# Patient Record
Sex: Male | Born: 1950 | Race: White | Hispanic: Yes | Marital: Married | Smoking: Never smoker
Health system: Southern US, Community
[De-identification: ages and names within clinical notes are randomized; demographics above are authoritative.]

---

## 2018-05-21 DIAGNOSIS — E119 Type 2 diabetes mellitus without complications: Secondary | ICD-10-CM | POA: Insufficient documentation

## 2018-05-21 DIAGNOSIS — I10 Essential (primary) hypertension: Secondary | ICD-10-CM | POA: Insufficient documentation

## 2018-05-23 DIAGNOSIS — E785 Hyperlipidemia, unspecified: Secondary | ICD-10-CM | POA: Insufficient documentation

## 2020-09-17 DIAGNOSIS — L84 Corns and callosities: Secondary | ICD-10-CM | POA: Diagnosis not present

## 2020-09-17 DIAGNOSIS — E1142 Type 2 diabetes mellitus with diabetic polyneuropathy: Secondary | ICD-10-CM | POA: Diagnosis not present

## 2020-10-02 DIAGNOSIS — R5383 Other fatigue: Secondary | ICD-10-CM | POA: Diagnosis not present

## 2020-10-02 DIAGNOSIS — Z0000 Encounter for general adult medical examination without abnormal findings: Secondary | ICD-10-CM | POA: Diagnosis not present

## 2020-10-08 ENCOUNTER — Ambulatory Visit (INDEPENDENT_AMBULATORY_CARE_PROVIDER_SITE_OTHER): Payer: 59

## 2020-10-08 ENCOUNTER — Ambulatory Visit (INDEPENDENT_AMBULATORY_CARE_PROVIDER_SITE_OTHER): Payer: 59 | Admitting: Podiatry

## 2020-10-08 ENCOUNTER — Encounter: Payer: Self-pay | Admitting: Podiatry

## 2020-10-08 ENCOUNTER — Other Ambulatory Visit: Payer: Self-pay

## 2020-10-08 DIAGNOSIS — E1142 Type 2 diabetes mellitus with diabetic polyneuropathy: Secondary | ICD-10-CM

## 2020-10-08 DIAGNOSIS — M2041 Other hammer toe(s) (acquired), right foot: Secondary | ICD-10-CM

## 2020-10-08 DIAGNOSIS — L603 Nail dystrophy: Secondary | ICD-10-CM | POA: Diagnosis not present

## 2020-10-08 DIAGNOSIS — M19072 Primary osteoarthritis, left ankle and foot: Secondary | ICD-10-CM | POA: Diagnosis not present

## 2020-10-08 DIAGNOSIS — M2042 Other hammer toe(s) (acquired), left foot: Secondary | ICD-10-CM

## 2020-10-11 NOTE — Progress Notes (Signed)
Subjective:  Patient ID: Collin [name], male    DOB: 1950-09-26,  MRN: [id number] HPI Chief Complaint  Patient presents with  . Toe Pain    Hallux left - previous ulcer on toe medially and now noticing a new area starting, just redness and slight abrasion currently, using mupirocin, keeps cushioned   5th toe right - darkened area medial side rubbing 4th toe  . Nail Problem    Hallux nail left - noticed more discoloration lately in the nail, but not sore  . Ankle Pain    Ankle left - ankle rolling inward, had checked before and reconstruction was recommended  . New Patient (Initial Visit)    69 y.o. male presents with the above complaint.   ROS: Denies fever chills nausea vomiting muscle aches pains calf pain back pain chest pain shortness of breath.  No past medical history on file.   Current Outpatient Medications:  .  aspirin EC 81 MG tablet, Take 81 mg by mouth daily. Swallow whole., Disp: , Rfl:  .  dicyclomine (BENTYL) 10 MG capsule, Take 10 mg by mouth 4 (four) times daily -  before meals and at bedtime., Disp: , Rfl:  .  isosorbide dinitrate (ISORDIL) 30 MG tablet, Take 30 mg by mouth 4 (four) times daily., Disp: , Rfl:  .  metoprolol tartrate (LOPRESSOR) 25 MG tablet, Take 25 mg by mouth 2 (two) times daily., Disp: , Rfl:  .  PRESCRIPTION MEDICATION, Domperidone 10mg  TID, Disp: , Rfl:  .  atorvastatin (LIPITOR) 80 MG tablet, Take 80 mg by mouth at bedtime., Disp: , Rfl:  .  gabapentin (NEURONTIN) 100 MG capsule, Take 100 mg by mouth 3 (three) times daily., Disp: , Rfl:  .  JARDIANCE 10 MG TABS tablet, Take 10 mg by mouth daily., Disp: , Rfl:  .  losartan (COZAAR) 25 MG tablet, Take 25 mg by mouth daily., Disp: , Rfl:  .  metFORMIN (GLUCOPHAGE) 1000 MG tablet, Take 1,000 mg by mouth 2 (two) times daily., Disp: , Rfl:  .  NOVOLOG 100 UNIT/ML injection, Inject into the skin., Disp: , Rfl:  .  omeprazole (PRILOSEC) 40 MG capsule, Take 40 mg by mouth daily., Disp: , Rfl:  .   traZODone (DESYREL) 100 MG tablet, Take 100 mg by mouth at bedtime., Disp: , Rfl:   No Known Allergies Review of Systems Objective:  There were no vitals filed for this visit.  General: Well developed, nourished, in no acute distress, alert and oriented x3   Dermatological: Skin is warm, dry and supple bilateral. Nails x 10 are well maintained; remaining integument appears unremarkable at this time. There are no open sores, no preulcerative lesions, no rash or signs of infection present. Multiple toenails appear to be thickened with nail dystrophy. Possible onychomycosis is noted.  Vascular: Dorsalis Pedis artery and Posterior Tibial artery pedal pulses are 2/4 bilateral with immedate capillary fill time. Pedal hair growth present. No varicosities and no lower extremity edema present bilateral.   Neruologic: Grossly absent t via light touch bilateral. Vibratory intact via tuning fork bilateral. Protective threshold with Semmes Wienstein monofilament absent to all pedal sites bilateral. Patellar and Achilles deep tendon reflexes 2+ bilateral. No Babinski or clonus noted bilateral.   Musculoskeletal: No gross boney pedal deformities bilateral. No pain, crepitus, or limitation noted with foot and ankle range of motion bilateral. Muscular strength 5/5 in all groups tested bilateral. Severe pronation of the subtalar joint also appears to be at the level of the  ankle left foot. Appears to be a midfoot collapse as well. Severe eversion of the subtalar joint calcaneus is noted. Right foot appears to be rectus. Mild hammertoe deformities are noted bilateral.  Gait: Unassisted, antalgic to the left side.   Radiographs:  Radiographs taken today of the bilateral foot and left ankle demonstrate an osseously mature individual right foot appears to be relatively normal with minimal osteoarthritic changes and minimal hammertoe deformities. 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 Also demonstrates a severe tibiotalar tilt in a valgus position. Most likely secondary to trauma there is considerable spurring at the plantar aspect of the bilateral foot with some soft tissue increase in density along the medial lateral tendon group on the left over the right.  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  Plan: Discussed etiology pathology conservative surgical therapies at this point I feel getting him in with Dr. Lilian Kapur school to be his best bet as far as considering reconstruction of his left foot and ankle. He will also follow-up with Dr. Donzetta Matters for routine nail debridement.     Juana Haralson T. [place], [place]

## 2020-10-27 DIAGNOSIS — K589 Irritable bowel syndrome without diarrhea: Secondary | ICD-10-CM | POA: Diagnosis not present

## 2020-10-27 DIAGNOSIS — R109 Unspecified abdominal pain: Secondary | ICD-10-CM | POA: Diagnosis not present

## 2020-10-27 DIAGNOSIS — Z955 Presence of coronary angioplasty implant and graft: Secondary | ICD-10-CM | POA: Diagnosis not present

## 2020-10-27 DIAGNOSIS — K219 Gastro-esophageal reflux disease without esophagitis: Secondary | ICD-10-CM | POA: Diagnosis not present

## 2020-11-03 ENCOUNTER — Other Ambulatory Visit: Payer: Self-pay

## 2020-11-03 ENCOUNTER — Ambulatory Visit (INDEPENDENT_AMBULATORY_CARE_PROVIDER_SITE_OTHER): Payer: 59 | Admitting: Podiatry

## 2020-11-03 ENCOUNTER — Ambulatory Visit (INDEPENDENT_AMBULATORY_CARE_PROVIDER_SITE_OTHER): Payer: 59

## 2020-11-03 DIAGNOSIS — E1142 Type 2 diabetes mellitus with diabetic polyneuropathy: Secondary | ICD-10-CM

## 2020-11-03 DIAGNOSIS — M19272 Secondary osteoarthritis, left ankle and foot: Secondary | ICD-10-CM | POA: Diagnosis not present

## 2020-11-03 DIAGNOSIS — M21079 Valgus deformity, not elsewhere classified, unspecified ankle: Secondary | ICD-10-CM

## 2020-11-03 DIAGNOSIS — M19072 Primary osteoarthritis, left ankle and foot: Secondary | ICD-10-CM

## 2020-11-03 DIAGNOSIS — M2042 Other hammer toe(s) (acquired), left foot: Secondary | ICD-10-CM

## 2020-11-03 DIAGNOSIS — M2041 Other hammer toe(s) (acquired), right foot: Secondary | ICD-10-CM | POA: Diagnosis not present

## 2020-11-03 NOTE — Progress Notes (Signed)
Subjective:  Patient ID: Collin [name], male    DOB: 1950-12-27,  MRN: [id number]  Chief Complaint  Patient presents with  . Diabetes    70 y.o. male presents with the above complaint. History confirmed with patient.  He is referred to me by Dr. Al Corpus for consideration of his left ankle and flatfoot deformity.  This is been the case for him for many years and has progressively worsened.  It is gotten the point where it is affecting his walking and is not able to walk for long distances.  He is previously seen by a doctor in [place] when they lived there several years ago and was told that surgery would be too risky and if should have it done would carry high risk.,  After the move to [place] he was seen by another doctor who had him fitted for what sounds like an [place] brace.  This helped at first but due to issues with what types of shoes he was able to wear with it he ended up not wearing it.  He does have type 2 diabetes, he says his A1c about 7%.  He does not smoke  Objective:  Physical Exam: warm, good capillary refill, no trophic changes or ulcerative lesions, normal DP and PT pulses and normal sensory exam.  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  Callus over the TN joint medially.  Large dorsal spurring midfoot.  Pain and edema around the ankle joint and subtalar joint.  He does have some range of motion ankle joint which is painful.  Subtalar joint range of motion appears to be maintained with some pain.   Radiographs: X-ray of the left foot and ankle: Severe pes planovalgus with valgus deformity of the ankle Assessment:   1. Diabetic polyneuropathy associated with type 2 diabetes mellitus (HCC)   2. Arthritis of left ankle   3. Hammer toes of both feet   4. Acquired valgus deformity of ankle   5. Other secondary osteoarthritis of left foot      Plan:   Patient was evaluated and treated and all questions answered.  I had a long discussion with the patient and his wife regarding his radiographic findings, clinical exam findings and what his treatment options would be.  We discussed surgical and nonsurgical treatment options.  He has had previous nonsurgical treatment with an [place] brace and this was unsuccessful.  They interested in surgical correction I think this would be the best route for him before he develops other health issues, or his diabetes becomes uncontrolled.  I think at minimum he will require ankle arthrodesis and some level of midfoot fusion such as a Lapidus or naviculocuneiform fusion in order to stabilize the midfoot and recreate the longitudinal arch.  Unclear if we will be able to salvage the subtalar joint at this point.  I discussed with him what the functionality of both isolated ankle arthrodesis versus tibiotalar calcaneal fusion would be and how special shoe gear may be needed to ambulate properly.  I am ordering MRI to evaluate the integrity of the subtalar joint to see if it is salvageable at this point.  I also gave a diagnostic block with 1.5 cc each of 2% Xylocaine and 0.5% Marcaine, he will monitor symptoms for the next day to see if this offers any pain relief.  If he does have significant pain relief then I think a  significant portion of his pain is from the subtalar joint and would likely require fusion.  Return for follow-up after MRI  Return in about 4 weeks (around 12/01/2020) for after MRI to review, check left ankle, discuss surgery.

## 2020-11-04 ENCOUNTER — Encounter: Payer: Self-pay | Admitting: Podiatry

## 2020-11-11 ENCOUNTER — Ambulatory Visit: Payer: 59 | Admitting: Podiatry

## 2020-11-13 ENCOUNTER — Other Ambulatory Visit: Payer: 59

## 2020-11-17 ENCOUNTER — Telehealth: Payer: Self-pay | Admitting: Podiatry

## 2020-11-17 NOTE — Telephone Encounter (Signed)
Patient calling to inform Dr. Lilian Kapur that Monia Pouch denied the order for MRI at [facility] imaging; would like to know why and how to move forward. Patient is supposed to come in for MRI follow up 4/14. Patient states [place] imaging did not file request until 3/23. Please advise.

## 2020-11-17 NOTE — Telephone Encounter (Signed)
Hi [name], do you know if Monia Pouch denied Mr Collin [name]'s MRI and what needs to be done to move forward with ti?

## 2020-11-23 DIAGNOSIS — E119 Type 2 diabetes mellitus without complications: Secondary | ICD-10-CM | POA: Diagnosis not present

## 2020-11-23 DIAGNOSIS — K219 Gastro-esophageal reflux disease without esophagitis: Secondary | ICD-10-CM | POA: Diagnosis not present

## 2020-11-23 DIAGNOSIS — Z01818 Encounter for other preprocedural examination: Secondary | ICD-10-CM | POA: Diagnosis not present

## 2020-11-24 DIAGNOSIS — E119 Type 2 diabetes mellitus without complications: Secondary | ICD-10-CM | POA: Diagnosis not present

## 2020-11-24 DIAGNOSIS — N481 Balanitis: Secondary | ICD-10-CM | POA: Diagnosis not present

## 2020-11-25 ENCOUNTER — Telehealth: Payer: Self-pay | Admitting: Podiatry

## 2020-11-25 NOTE — Telephone Encounter (Signed)
Patient wife called inquiring about denial for MRI, stated she was told someone would send message and get back with them shortly regarding this matter. No follow up has been completed at this moment, Please Advise

## 2020-11-25 NOTE — Telephone Encounter (Signed)
Ok thanks 

## 2020-11-25 NOTE — Telephone Encounter (Signed)
Also wanted to know if they should reschedule the MRI for later date

## 2020-11-25 NOTE — Telephone Encounter (Signed)
Done, Thanks.

## 2020-11-25 NOTE — Telephone Encounter (Signed)
Looks like he rescheduled his MRI for 4/22 already, can you re-schedule him for me for the following week?

## 2020-11-25 NOTE — Telephone Encounter (Signed)
We are still working on it with the insurance

## 2020-11-27 ENCOUNTER — Ambulatory Visit: Payer: 59 | Admitting: Podiatry

## 2020-11-27 ENCOUNTER — Other Ambulatory Visit: Payer: 59

## 2020-11-27 DIAGNOSIS — K900 Celiac disease: Secondary | ICD-10-CM | POA: Diagnosis not present

## 2020-11-27 DIAGNOSIS — A048 Other specified bacterial intestinal infections: Secondary | ICD-10-CM | POA: Diagnosis not present

## 2020-11-27 DIAGNOSIS — K2270 Barrett's esophagus without dysplasia: Secondary | ICD-10-CM | POA: Diagnosis not present

## 2020-11-27 DIAGNOSIS — K2970 Gastritis, unspecified, without bleeding: Secondary | ICD-10-CM | POA: Diagnosis not present

## 2020-11-30 DIAGNOSIS — K200 Eosinophilic esophagitis: Secondary | ICD-10-CM | POA: Diagnosis not present

## 2020-11-30 DIAGNOSIS — K2281 Esophageal polyp: Secondary | ICD-10-CM | POA: Diagnosis not present

## 2020-12-03 ENCOUNTER — Ambulatory Visit: Payer: 59 | Admitting: Podiatry

## 2020-12-11 ENCOUNTER — Ambulatory Visit
Admission: RE | Admit: 2020-12-11 | Discharge: 2020-12-11 | Disposition: A | Discharge status: Home / Self Care | Payer: 59 | Source: Ambulatory Visit | Attending: Podiatry | Admitting: Podiatry

## 2020-12-11 DIAGNOSIS — S86312A Strain of muscle(s) and tendon(s) of peroneal muscle group at lower leg level, left leg, initial encounter: Secondary | ICD-10-CM | POA: Diagnosis not present

## 2020-12-11 DIAGNOSIS — M65872 Other synovitis and tenosynovitis, left ankle and foot: Secondary | ICD-10-CM | POA: Diagnosis not present

## 2020-12-11 DIAGNOSIS — M1712 Unilateral primary osteoarthritis, left knee: Secondary | ICD-10-CM | POA: Diagnosis not present

## 2020-12-11 DIAGNOSIS — E1142 Type 2 diabetes mellitus with diabetic polyneuropathy: Secondary | ICD-10-CM

## 2020-12-11 DIAGNOSIS — M6258 Muscle wasting and atrophy, not elsewhere classified, other site: Secondary | ICD-10-CM | POA: Diagnosis not present

## 2020-12-11 DIAGNOSIS — M2041 Other hammer toe(s) (acquired), right foot: Secondary | ICD-10-CM

## 2020-12-11 DIAGNOSIS — M21079 Valgus deformity, not elsewhere classified, unspecified ankle: Secondary | ICD-10-CM

## 2020-12-11 DIAGNOSIS — M19072 Primary osteoarthritis, left ankle and foot: Secondary | ICD-10-CM

## 2020-12-11 DIAGNOSIS — M2042 Other hammer toe(s) (acquired), left foot: Secondary | ICD-10-CM

## 2020-12-17 ENCOUNTER — Ambulatory Visit (INDEPENDENT_AMBULATORY_CARE_PROVIDER_SITE_OTHER): Payer: 59 | Admitting: Podiatry

## 2020-12-17 ENCOUNTER — Other Ambulatory Visit: Payer: Self-pay

## 2020-12-17 DIAGNOSIS — M19072 Primary osteoarthritis, left ankle and foot: Secondary | ICD-10-CM

## 2020-12-17 DIAGNOSIS — M21079 Valgus deformity, not elsewhere classified, unspecified ankle: Secondary | ICD-10-CM

## 2020-12-17 DIAGNOSIS — M19272 Secondary osteoarthritis, left ankle and foot: Secondary | ICD-10-CM | POA: Diagnosis not present

## 2020-12-17 DIAGNOSIS — E1142 Type 2 diabetes mellitus with diabetic polyneuropathy: Secondary | ICD-10-CM

## 2020-12-17 NOTE — Patient Instructions (Signed)
We will send your referral to [facility], if you do not hear from them within in 2 weeks please let me know

## 2020-12-19 ENCOUNTER — Encounter: Payer: Self-pay | Admitting: Podiatry

## 2020-12-19 NOTE — Progress Notes (Signed)
Subjective:  Patient ID: Collin [name], male    DOB: 20-May-1951,  MRN: [id number]  Chief Complaint  Patient presents with  . Diabetes    PT stated that he is still having some pain    70 y.o. male presents with the above complaint. History confirmed with patient.  He returns after completing his MRI.  Diagnostic injections did alleviate his pain for that day but then returned  Objective:  Physical Exam: warm, good capillary refill, no trophic changes or ulcerative lesions, normal DP and PT pulses and normal sensory exam.  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  Callus over the TN joint medially.  Large dorsal spurring midfoot.  Pain and edema around the ankle joint and subtalar joint.  He does have some range of motion ankle joint which is painful.  Subtalar joint range of motion appears to be maintained with some pain.   Radiographs: X-ray of the left foot and ankle: Severe pes planovalgus with valgus deformity of the ankle  Study Result  Narrative & Impression  CLINICAL DATA:  Left ankle pain for years.  EXAM: MRI OF THE LEFT ANKLE WITHOUT CONTRAST  TECHNIQUE: Multiplanar, multisequence MR imaging of the ankle was performed. No intravenous contrast was administered.  COMPARISON:  None.  FINDINGS: TENDONS  Peroneal: Peroneal longus tendon intact. Mild tendinosis of the peroneus brevis with a longitudinal split tear of the level of the lateral malleolus and mild tenosynovitis.  Posteromedial: Mild tendinosis of the posterior tibial tendon with mild tenosynovitis. Flexor hallucis longus tendon intact. Flexor digitorum longus tendon intact.  Anterior: Tibialis anterior tendon intact. Extensor hallucis longus tendon intact Extensor digitorum longus tendon intact.  Achilles:  Intact.  Plantar Fascia: Intact. Plantar calcaneal  spur.  LIGAMENTS  Lateral: Thickening of the anterior talofibular ligament likely reflecting prior injury. Calcaneofibular ligament intact. Posterior talofibular ligament intact. Anterior and posterior tibiofibular ligaments intact.  Medial: Deltoid ligament intact. Spring ligament intact.  CARTILAGE  Ankle Joint: Moderate ankle joint effusion. 7 mm loose body in the anterolateral ankle joint space. Partial-thickness cartilage loss throughout the tibiotalar joint.  Subtalar Joints/Sinus Tarsi: Normal subtalar joints. No subtalar joint effusion. Effacement of the normal sinus tarsi fat with subcortical reactive marrow changes in the talus and calcaneus.  Bones: Hindfoot valgus. Reactive marrow changes in the distal tip of the fibula at the articulation with the calcaneus as can be seen with sub fibular impingement. Moderate osteoarthritis of the second tarsometatarsal joint. Mild osteoarthritis of the first TMT joint. Mild osteoarthritis of the talonavicular joint. 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  Soft Tissue: No fluid collection or hematoma. Mild muscle atrophy of the flexor hallucis muscle. Tarsal tunnel is normal.  IMPRESSION: 1. Mild tendinosis of the peroneus brevis with a longitudinal split tear of the level of the lateral malleolus and mild tenosynovitis. 2. Mild tendinosis of the posterior tibial tendon with mild tenosynovitis. 3. Hindfoot valgus. Reactive marrow changes in the distal tip of the fibula at the articulation with the calcaneus as can be seen with sub fibular impingement. 4. Mild osteoarthritis of the first TMT joint. 5. 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   Electronically Signed   By: Elige Ko   On: 12/12/2020 13:24    Assessment:   1. Diabetic polyneuropathy associated with type 2 diabetes mellitus (HCC)   2. Arthritis of left ankle   3. Acquired valgus deformity  of ankle   4.  Other secondary osteoarthritis of left foot      Plan:  Patient was evaluated and treated and all questions answered.  Reviewed the findings of the MRI and clinical exam with the patient and his wife.  I discussed with him that his deformity and arthritis is quite profound and severe and extends throughout the midfoot hindfoot and into the ankle joint.  I discussed with him he likely will need major stage reconstruction with multiple metatarsal fusions as well as subtalar joint fusion, talonavicular fusion and either ankle arthrodesis or ankle replacement.  I think at his age and activity level he be best served with an ankle arthroplasty with implant replacement.  I think with the complexity and severity of the deformity this would be something best handled by an expert in this field.  I am referring him to [facility] orthopedics for this.  Return to see me if he has other issues or he is unable to be treated by them.  Return if symptoms worsen or fail to improve.

## 2020-12-22 DIAGNOSIS — K2900 Acute gastritis without bleeding: Secondary | ICD-10-CM | POA: Diagnosis not present

## 2020-12-22 DIAGNOSIS — K589 Irritable bowel syndrome without diarrhea: Secondary | ICD-10-CM | POA: Diagnosis not present

## 2020-12-22 DIAGNOSIS — Z8719 Personal history of other diseases of the digestive system: Secondary | ICD-10-CM | POA: Diagnosis not present

## 2020-12-22 DIAGNOSIS — Z8619 Personal history of other infectious and parasitic diseases: Secondary | ICD-10-CM | POA: Diagnosis not present

## 2020-12-22 NOTE — Progress Notes (Signed)
I faxed the referral order, last office note and insurance information over to [employer]. They will contact the patient to set-up an appointment.

## 2020-12-24 ENCOUNTER — Telehealth: Payer: Self-pay | Admitting: Podiatry

## 2020-12-24 DIAGNOSIS — N481 Balanitis: Secondary | ICD-10-CM | POA: Diagnosis not present

## 2020-12-24 DIAGNOSIS — E119 Type 2 diabetes mellitus without complications: Secondary | ICD-10-CM | POA: Diagnosis not present

## 2020-12-24 DIAGNOSIS — E7800 Pure hypercholesterolemia, unspecified: Secondary | ICD-10-CM | POA: Diagnosis not present

## 2020-12-24 DIAGNOSIS — Z79899 Other long term (current) drug therapy: Secondary | ICD-10-CM | POA: Diagnosis not present

## 2020-12-24 NOTE — Telephone Encounter (Signed)
Patient called inquiring about referral to [facility], stated they were told if they hadn't heard anything to contact office, Please Advise

## 2020-12-24 NOTE — Telephone Encounter (Signed)
Collin [name] sent it on Monday and they should contact him soon

## 2021-01-04 ENCOUNTER — Other Ambulatory Visit: Payer: Self-pay

## 2021-01-04 ENCOUNTER — Ambulatory Visit (INDEPENDENT_AMBULATORY_CARE_PROVIDER_SITE_OTHER): Payer: 59 | Admitting: Podiatry

## 2021-01-04 DIAGNOSIS — L84 Corns and callosities: Secondary | ICD-10-CM | POA: Diagnosis not present

## 2021-01-04 DIAGNOSIS — M79675 Pain in left toe(s): Secondary | ICD-10-CM

## 2021-01-04 DIAGNOSIS — B351 Tinea unguium: Secondary | ICD-10-CM

## 2021-01-04 DIAGNOSIS — M79674 Pain in right toe(s): Secondary | ICD-10-CM

## 2021-01-04 DIAGNOSIS — E1142 Type 2 diabetes mellitus with diabetic polyneuropathy: Secondary | ICD-10-CM

## 2021-01-07 DIAGNOSIS — Z79899 Other long term (current) drug therapy: Secondary | ICD-10-CM | POA: Diagnosis not present

## 2021-01-07 DIAGNOSIS — E7800 Pure hypercholesterolemia, unspecified: Secondary | ICD-10-CM | POA: Diagnosis not present

## 2021-01-07 DIAGNOSIS — E119 Type 2 diabetes mellitus without complications: Secondary | ICD-10-CM | POA: Diagnosis not present

## 2021-01-08 DIAGNOSIS — L918 Other hypertrophic disorders of the skin: Secondary | ICD-10-CM | POA: Diagnosis not present

## 2021-01-08 DIAGNOSIS — Z1283 Encounter for screening for malignant neoplasm of skin: Secondary | ICD-10-CM | POA: Diagnosis not present

## 2021-01-08 DIAGNOSIS — L814 Other melanin hyperpigmentation: Secondary | ICD-10-CM | POA: Diagnosis not present

## 2021-01-08 DIAGNOSIS — L821 Other seborrheic keratosis: Secondary | ICD-10-CM | POA: Diagnosis not present

## 2021-01-09 ENCOUNTER — Encounter: Payer: Self-pay | Admitting: Podiatry

## 2021-01-09 NOTE — Progress Notes (Signed)
  Subjective:  Patient ID: Collin [name], male    DOB: 12/17/1950,  MRN: [id number]  70 y.o. male presents with preventative diabetic foot care and corn(s) b/l 2nd digits and painful thick toenails that are difficult to trim. Painful toenails interfere with ambulation. Aggravating factors include wearing enclosed shoe gear. Pain is relieved with periodic professional debridement. Painful corns are aggravated when weightbearing when wearing enclosed shoe gear. Pain is relieved with periodic professional debridement..    Patient does not check blood glucose daily.  He is inquiring about his referral to [facility] for consultation of his left ankle as he has not been contacted by them.  PCP: Collin Real, MD and last visit was: Dec 24, 2020.  Review of Systems: Negative except as noted in the HPI.   No Known Allergies  Objective:  There were no vitals filed for this visit. Constitutional Patient is a pleasant 70 y.o. } male in NAD. AAO x 3.  Vascular Capillary refill time to digits immediate b/l. Palpable pedal pulses b/l LE. Pedal hair present. Lower extremity skin temperature gradient within normal limits. No pain with calf compression b/l. Varicosities present b/l. No cyanosis or clubbing noted.  Neurologic Normal speech. Protective sensation intact 5/5 intact bilaterally with 10g monofilament b/l. Vibratory sensation intact b/l. Proprioception intact bilaterally.  Dermatologic Pedal skin with normal turgor, texture and tone bilaterally. No open wounds bilaterally. No interdigital macerations bilaterally. Toenails 1-5 b/l elongated, discolored, dystrophic, thickened, crumbly with subungual debris and tenderness to dorsal palpation. Hyperkeratotic lesion(s) L 2nd toe and R 2nd toe.  No erythema, no edema, no drainage, no fluctuance.  Orthopedic: Normal muscle strength 5/5 to all lower extremity muscle groups bilaterally. Severe pes planovalgus deformity of LLE with total collapse of medial  midfoot. No pain crepitus or joint limitation noted with ROM b/l. Limited joint ROM to the left ankle.    Assessment:   1. Pain due to onychomycosis of toenails of both feet   2. Corns   3. Diabetic polyneuropathy associated with type 2 diabetes mellitus (HCC)    Plan:  Patient was evaluated and treated and all questions answered.  Onychomycosis with pain -Nails palliatively debridement as below. -Educated on self-care  Procedure: Nail Debridement Rationale: Pain Type of Debridement: manual, sharp debridement. Instrumentation: Nail nipper, rotary burr. Number of Nails: 10  -Examined patient. -Patient to continue soft, supportive shoe gear daily. -We did discuss obtaining diabetic shoe gear, but will defer due to him most likely having reconstructive surgery on left foot/ankle in the near future. -Our office will check status of Collin [name]'s referral to [facility] for consultation regarding left foot/ankle OA.  -Toenails 1-5 b/l were debrided in length and girth with sterile nail nippers and dremel without iatrogenic bleeding.  -Corn(s) L 2nd toe and R 2nd toe pared utilizing sterile scalpel blade without complication or incident. Total number debrided=2. -Patient to report any pedal injuries to medical professional immediately. -Patient/POA to call should there be question/concern in the interim.  Return in about 3 months (around 04/06/2021).  Freddie Breech, DPM

## 2021-01-11 NOTE — Telephone Encounter (Signed)
Pt has called back because [facility] has not called her about the referral we sent over. Can someone give me the number so that I can call.  Thanks ;-)

## 2021-01-11 NOTE — Telephone Encounter (Signed)
The main referral line is: [phone]

## 2021-01-12 ENCOUNTER — Telehealth: Payer: Self-pay | Admitting: Podiatry

## 2021-01-12 NOTE — Telephone Encounter (Signed)
The following patient called inquiring about a referral to [facility] for further observation of condition, stated they tried to contact [facility] for scheduling and was informed that the referral hasn't been received, Please Advise

## 2021-01-19 DIAGNOSIS — H3500 Unspecified background retinopathy: Secondary | ICD-10-CM | POA: Diagnosis not present

## 2021-01-19 DIAGNOSIS — G629 Polyneuropathy, unspecified: Secondary | ICD-10-CM | POA: Diagnosis not present

## 2021-01-19 DIAGNOSIS — I2510 Atherosclerotic heart disease of native coronary artery without angina pectoris: Secondary | ICD-10-CM | POA: Diagnosis not present

## 2021-01-19 DIAGNOSIS — E1165 Type 2 diabetes mellitus with hyperglycemia: Secondary | ICD-10-CM | POA: Diagnosis not present

## 2021-01-19 DIAGNOSIS — E7800 Pure hypercholesterolemia, unspecified: Secondary | ICD-10-CM | POA: Diagnosis not present

## 2021-01-19 DIAGNOSIS — I10 Essential (primary) hypertension: Secondary | ICD-10-CM | POA: Diagnosis not present

## 2021-01-19 DIAGNOSIS — K3184 Gastroparesis: Secondary | ICD-10-CM | POA: Diagnosis not present

## 2021-02-04 DIAGNOSIS — M19072 Primary osteoarthritis, left ankle and foot: Secondary | ICD-10-CM | POA: Diagnosis not present

## 2021-02-04 DIAGNOSIS — Z01818 Encounter for other preprocedural examination: Secondary | ICD-10-CM | POA: Diagnosis not present

## 2021-02-04 DIAGNOSIS — Q666 Other congenital valgus deformities of feet: Secondary | ICD-10-CM | POA: Diagnosis not present

## 2021-02-04 DIAGNOSIS — M7732 Calcaneal spur, left foot: Secondary | ICD-10-CM | POA: Diagnosis not present

## 2021-02-04 DIAGNOSIS — M79672 Pain in left foot: Secondary | ICD-10-CM | POA: Diagnosis not present

## 2021-02-04 DIAGNOSIS — G8929 Other chronic pain: Secondary | ICD-10-CM | POA: Diagnosis not present

## 2021-02-09 DIAGNOSIS — H2513 Age-related nuclear cataract, bilateral: Secondary | ICD-10-CM | POA: Diagnosis not present

## 2021-02-09 DIAGNOSIS — H35033 Hypertensive retinopathy, bilateral: Secondary | ICD-10-CM | POA: Diagnosis not present

## 2021-02-09 DIAGNOSIS — H43823 Vitreomacular adhesion, bilateral: Secondary | ICD-10-CM | POA: Diagnosis not present

## 2021-02-09 DIAGNOSIS — E113293 Type 2 diabetes mellitus with mild nonproliferative diabetic retinopathy without macular edema, bilateral: Secondary | ICD-10-CM | POA: Diagnosis not present

## 2021-02-10 DIAGNOSIS — E1165 Type 2 diabetes mellitus with hyperglycemia: Secondary | ICD-10-CM | POA: Insufficient documentation

## 2021-02-10 HISTORY — DX: Type 2 diabetes mellitus with hyperglycemia: E11.65

## 2021-02-26 DIAGNOSIS — I10 Essential (primary) hypertension: Secondary | ICD-10-CM | POA: Diagnosis not present

## 2021-02-26 DIAGNOSIS — E782 Mixed hyperlipidemia: Secondary | ICD-10-CM | POA: Diagnosis not present

## 2021-02-26 DIAGNOSIS — E119 Type 2 diabetes mellitus without complications: Secondary | ICD-10-CM | POA: Diagnosis not present

## 2021-02-26 DIAGNOSIS — R0602 Shortness of breath: Secondary | ICD-10-CM | POA: Diagnosis not present

## 2021-02-26 DIAGNOSIS — I440 Atrioventricular block, first degree: Secondary | ICD-10-CM | POA: Diagnosis not present

## 2021-02-26 DIAGNOSIS — I209 Angina pectoris, unspecified: Secondary | ICD-10-CM | POA: Diagnosis not present

## 2021-02-26 DIAGNOSIS — I2510 Atherosclerotic heart disease of native coronary artery without angina pectoris: Secondary | ICD-10-CM | POA: Diagnosis not present

## 2021-02-26 DIAGNOSIS — E669 Obesity, unspecified: Secondary | ICD-10-CM | POA: Diagnosis not present

## 2021-02-26 DIAGNOSIS — Z9861 Coronary angioplasty status: Secondary | ICD-10-CM | POA: Diagnosis not present

## 2021-03-11 DIAGNOSIS — R0602 Shortness of breath: Secondary | ICD-10-CM | POA: Diagnosis not present

## 2021-03-16 DIAGNOSIS — I2510 Atherosclerotic heart disease of native coronary artery without angina pectoris: Secondary | ICD-10-CM | POA: Diagnosis not present

## 2021-03-16 DIAGNOSIS — I10 Essential (primary) hypertension: Secondary | ICD-10-CM | POA: Diagnosis not present

## 2021-03-16 DIAGNOSIS — R0602 Shortness of breath: Secondary | ICD-10-CM | POA: Diagnosis not present

## 2021-03-16 DIAGNOSIS — E782 Mixed hyperlipidemia: Secondary | ICD-10-CM | POA: Diagnosis not present

## 2021-03-22 DIAGNOSIS — E1165 Type 2 diabetes mellitus with hyperglycemia: Secondary | ICD-10-CM | POA: Diagnosis not present

## 2021-03-24 DIAGNOSIS — H35033 Hypertensive retinopathy, bilateral: Secondary | ICD-10-CM | POA: Diagnosis not present

## 2021-03-24 DIAGNOSIS — E113293 Type 2 diabetes mellitus with mild nonproliferative diabetic retinopathy without macular edema, bilateral: Secondary | ICD-10-CM | POA: Diagnosis not present

## 2021-03-24 DIAGNOSIS — H2513 Age-related nuclear cataract, bilateral: Secondary | ICD-10-CM | POA: Diagnosis not present

## 2021-03-24 DIAGNOSIS — H43823 Vitreomacular adhesion, bilateral: Secondary | ICD-10-CM | POA: Diagnosis not present

## 2021-03-30 DIAGNOSIS — G629 Polyneuropathy, unspecified: Secondary | ICD-10-CM | POA: Diagnosis not present

## 2021-03-30 DIAGNOSIS — M216X2 Other acquired deformities of left foot: Secondary | ICD-10-CM | POA: Insufficient documentation

## 2021-03-30 DIAGNOSIS — I2510 Atherosclerotic heart disease of native coronary artery without angina pectoris: Secondary | ICD-10-CM | POA: Diagnosis not present

## 2021-03-30 DIAGNOSIS — K3184 Gastroparesis: Secondary | ICD-10-CM | POA: Diagnosis not present

## 2021-03-30 DIAGNOSIS — H3500 Unspecified background retinopathy: Secondary | ICD-10-CM | POA: Diagnosis not present

## 2021-03-30 DIAGNOSIS — I10 Essential (primary) hypertension: Secondary | ICD-10-CM | POA: Diagnosis not present

## 2021-03-30 DIAGNOSIS — Z794 Long term (current) use of insulin: Secondary | ICD-10-CM | POA: Diagnosis not present

## 2021-03-30 DIAGNOSIS — N183 Chronic kidney disease, stage 3 unspecified: Secondary | ICD-10-CM | POA: Diagnosis not present

## 2021-03-30 DIAGNOSIS — Z01818 Encounter for other preprocedural examination: Secondary | ICD-10-CM | POA: Diagnosis not present

## 2021-03-30 DIAGNOSIS — G4733 Obstructive sleep apnea (adult) (pediatric): Secondary | ICD-10-CM | POA: Diagnosis not present

## 2021-03-30 DIAGNOSIS — E1165 Type 2 diabetes mellitus with hyperglycemia: Secondary | ICD-10-CM | POA: Diagnosis not present

## 2021-04-12 ENCOUNTER — Ambulatory Visit: Payer: 59 | Admitting: Podiatry

## 2021-04-15 DIAGNOSIS — E1165 Type 2 diabetes mellitus with hyperglycemia: Secondary | ICD-10-CM | POA: Diagnosis not present

## 2021-04-20 DIAGNOSIS — Z20822 Contact with and (suspected) exposure to covid-19: Secondary | ICD-10-CM | POA: Diagnosis not present

## 2021-04-20 DIAGNOSIS — Z01818 Encounter for other preprocedural examination: Secondary | ICD-10-CM | POA: Diagnosis not present

## 2021-04-21 DIAGNOSIS — I10 Essential (primary) hypertension: Secondary | ICD-10-CM | POA: Diagnosis not present

## 2021-04-21 DIAGNOSIS — M19072 Primary osteoarthritis, left ankle and foot: Secondary | ICD-10-CM | POA: Diagnosis not present

## 2021-04-21 DIAGNOSIS — E119 Type 2 diabetes mellitus without complications: Secondary | ICD-10-CM | POA: Diagnosis not present

## 2021-05-26 DIAGNOSIS — E1165 Type 2 diabetes mellitus with hyperglycemia: Secondary | ICD-10-CM | POA: Diagnosis not present

## 2021-05-26 DIAGNOSIS — I10 Essential (primary) hypertension: Secondary | ICD-10-CM | POA: Diagnosis not present

## 2021-05-26 DIAGNOSIS — I25118 Atherosclerotic heart disease of native coronary artery with other forms of angina pectoris: Secondary | ICD-10-CM | POA: Diagnosis not present

## 2021-05-26 DIAGNOSIS — E1143 Type 2 diabetes mellitus with diabetic autonomic (poly)neuropathy: Secondary | ICD-10-CM | POA: Diagnosis not present

## 2021-05-26 DIAGNOSIS — E7800 Pure hypercholesterolemia, unspecified: Secondary | ICD-10-CM | POA: Diagnosis not present

## 2021-05-26 DIAGNOSIS — Z23 Encounter for immunization: Secondary | ICD-10-CM | POA: Diagnosis not present

## 2021-06-10 DIAGNOSIS — M216X2 Other acquired deformities of left foot: Secondary | ICD-10-CM | POA: Diagnosis not present

## 2021-06-10 DIAGNOSIS — M19072 Primary osteoarthritis, left ankle and foot: Secondary | ICD-10-CM | POA: Diagnosis not present

## 2021-06-11 ENCOUNTER — Ambulatory Visit: Payer: 59 | Admitting: Podiatry

## 2021-06-16 DIAGNOSIS — I10 Essential (primary) hypertension: Secondary | ICD-10-CM | POA: Diagnosis not present

## 2021-06-16 DIAGNOSIS — E1165 Type 2 diabetes mellitus with hyperglycemia: Secondary | ICD-10-CM | POA: Diagnosis not present

## 2021-06-16 DIAGNOSIS — E7800 Pure hypercholesterolemia, unspecified: Secondary | ICD-10-CM | POA: Diagnosis not present

## 2021-06-16 DIAGNOSIS — I25118 Atherosclerotic heart disease of native coronary artery with other forms of angina pectoris: Secondary | ICD-10-CM | POA: Diagnosis not present

## 2021-06-23 ENCOUNTER — Telehealth (HOSPITAL_BASED_OUTPATIENT_CLINIC_OR_DEPARTMENT_OTHER): Payer: Self-pay | Admitting: Physician Assistant

## 2021-06-23 DIAGNOSIS — E1165 Type 2 diabetes mellitus with hyperglycemia: Secondary | ICD-10-CM | POA: Diagnosis not present

## 2021-06-23 DIAGNOSIS — E7800 Pure hypercholesterolemia, unspecified: Secondary | ICD-10-CM | POA: Diagnosis not present

## 2021-06-23 DIAGNOSIS — G629 Polyneuropathy, unspecified: Secondary | ICD-10-CM | POA: Diagnosis not present

## 2021-06-23 DIAGNOSIS — I10 Essential (primary) hypertension: Secondary | ICD-10-CM | POA: Diagnosis not present

## 2021-07-08 DIAGNOSIS — Z981 Arthrodesis status: Secondary | ICD-10-CM | POA: Diagnosis not present

## 2021-07-08 DIAGNOSIS — M19072 Primary osteoarthritis, left ankle and foot: Secondary | ICD-10-CM | POA: Diagnosis not present

## 2021-07-27 DIAGNOSIS — Z23 Encounter for immunization: Secondary | ICD-10-CM | POA: Diagnosis not present

## 2021-07-27 DIAGNOSIS — E1142 Type 2 diabetes mellitus with diabetic polyneuropathy: Secondary | ICD-10-CM | POA: Diagnosis not present

## 2021-07-27 DIAGNOSIS — E7800 Pure hypercholesterolemia, unspecified: Secondary | ICD-10-CM | POA: Diagnosis not present

## 2021-07-27 DIAGNOSIS — Z794 Long term (current) use of insulin: Secondary | ICD-10-CM | POA: Diagnosis not present

## 2021-07-27 DIAGNOSIS — I25118 Atherosclerotic heart disease of native coronary artery with other forms of angina pectoris: Secondary | ICD-10-CM | POA: Diagnosis not present

## 2021-07-27 DIAGNOSIS — I10 Essential (primary) hypertension: Secondary | ICD-10-CM | POA: Diagnosis not present

## 2021-07-27 DIAGNOSIS — K3184 Gastroparesis: Secondary | ICD-10-CM | POA: Diagnosis not present

## 2021-08-18 ENCOUNTER — Encounter (HOSPITAL_BASED_OUTPATIENT_CLINIC_OR_DEPARTMENT_OTHER): Payer: 59 | Admitting: Physician Assistant

## 2021-08-26 DIAGNOSIS — Q666 Other congenital valgus deformities of feet: Secondary | ICD-10-CM | POA: Diagnosis not present

## 2021-08-26 DIAGNOSIS — M19072 Primary osteoarthritis, left ankle and foot: Secondary | ICD-10-CM | POA: Diagnosis not present

## 2021-08-26 DIAGNOSIS — M216X2 Other acquired deformities of left foot: Secondary | ICD-10-CM | POA: Diagnosis not present

## 2021-08-26 DIAGNOSIS — M7732 Calcaneal spur, left foot: Secondary | ICD-10-CM | POA: Diagnosis not present

## 2021-09-13 ENCOUNTER — Ambulatory Visit: Payer: 59 | Admitting: Podiatry

## 2021-10-12 DIAGNOSIS — K3184 Gastroparesis: Secondary | ICD-10-CM | POA: Diagnosis not present

## 2021-10-12 DIAGNOSIS — Z794 Long term (current) use of insulin: Secondary | ICD-10-CM | POA: Diagnosis not present

## 2021-10-12 DIAGNOSIS — Z125 Encounter for screening for malignant neoplasm of prostate: Secondary | ICD-10-CM | POA: Diagnosis not present

## 2021-10-12 DIAGNOSIS — E1142 Type 2 diabetes mellitus with diabetic polyneuropathy: Secondary | ICD-10-CM | POA: Diagnosis not present

## 2021-10-12 DIAGNOSIS — R5383 Other fatigue: Secondary | ICD-10-CM | POA: Diagnosis not present

## 2021-10-12 DIAGNOSIS — I25118 Atherosclerotic heart disease of native coronary artery with other forms of angina pectoris: Secondary | ICD-10-CM | POA: Diagnosis not present

## 2021-10-12 DIAGNOSIS — I10 Essential (primary) hypertension: Secondary | ICD-10-CM | POA: Diagnosis not present

## 2021-10-12 DIAGNOSIS — E7800 Pure hypercholesterolemia, unspecified: Secondary | ICD-10-CM | POA: Diagnosis not present

## 2021-10-13 ENCOUNTER — Ambulatory Visit: Payer: 59 | Admitting: Physical Therapy

## 2021-10-13 ENCOUNTER — Other Ambulatory Visit: Payer: Self-pay

## 2021-10-13 NOTE — Therapy (Incomplete)
OUTPATIENT PHYSICAL THERAPY LOWER EXTREMITY EVALUATION   Patient Name: Collin [name] MRN: [id number] DOB:1951/07/12, 71 y.o., male Today's Date: 10/13/2021    No past medical history on file. No past surgical history on file. There are no problems to display for this patient.   PCP: Sandi Mariscal, MD  REFERRING PROVIDER: Sandi Mariscal, MD  REFERRING DIAG: ***  THERAPY DIAG:  No diagnosis found.  ONSET DATE: ***  SUBJECTIVE:   SUBJECTIVE STATEMENT: ***  PERTINENT HISTORY: ***  PAIN:  Are you having pain? {yes/no:20286} NPRS scale: ***/10 Pain location: *** Pain orientation: {Pain Orientation:25161}  PAIN TYPE: {type:313116} Pain description: {PAIN DESCRIPTION:21022940}  Aggravating factors: *** Relieving factors: ***  PRECAUTIONS: {Therapy precautions:24002}  WEIGHT BEARING RESTRICTIONS {Yes ***/No:24003}  FALLS:  Has patient fallen in last 6 months? {yes/no:20286}, Number of falls: ***  LIVING ENVIRONMENT: Lives with: {[facility] lives with:25569::"lives with their family"} Lives in: {Lives in:25570} Stairs: {yes/no:20286}; {Stairs:24000} Has following equipment at home: {Assistive devices:23999}  OCCUPATION: ***  PLOF: {PLOF:24004}  PATIENT GOALS ***   OBJECTIVE:   DIAGNOSTIC FINDINGS: ***  PATIENT SURVEYS:  {rehab surveys:24030}  COGNITION:  Overall cognitive status: {cognition:24006}     SENSATION:  Light touch: {intact/deficits:24005}  Stereognosis: {intact/deficits:24005}  Hot/Cold: {intact/deficits:24005}  Proprioception: {intact/deficits:24005}  MUSCLE LENGTH: Hamstrings: Right *** deg; Left *** deg Thomas test: Right *** deg; Left *** deg  POSTURE:  ***  PALPATION: ***  LE AROM/PROM:  A/PROM Right 10/13/2021 Left 10/13/2021  Hip flexion    Hip extension    Hip abduction    Hip adduction    Hip internal rotation    Hip external rotation    Knee flexion    Knee extension    Ankle dorsiflexion    Ankle plantarflexion    Ankle  inversion    Ankle eversion     (Blank rows = not tested)  LE MMT:  MMT Right 10/13/2021 Left 10/13/2021  Hip flexion    Hip extension    Hip abduction    Hip adduction    Hip internal rotation    Hip external rotation    Knee flexion    Knee extension    Ankle dorsiflexion    Ankle plantarflexion    Ankle inversion    Ankle eversion     (Blank rows = not tested)  LOWER EXTREMITY SPECIAL TESTS:  {LEspecialtests:26242}  FUNCTIONAL TESTS:  {Functional tests:24029}  GAIT: Distance walked: *** Assistive device utilized: {Assistive devices:23999} Level of assistance: {Levels of assistance:24026} Comments: ***    TODAY'S TREATMENT: ***   PATIENT EDUCATION:  Education details: *** Person educated: {Person educated:25204} Education method: {Education Method:25205} Education comprehension: {Education Comprehension:25206}   HOME EXERCISE PROGRAM: ***  ASSESSMENT:  CLINICAL IMPRESSION: Patient is a *** y.o. *** who was seen today for physical therapy evaluation and treatment for ***.    OBJECTIVE IMPAIRMENTS {opptimpairments:25111}.   ACTIVITY LIMITATIONS {activity limitations:25113}.   PERSONAL FACTORS {Personal factors:25162} are also affecting patient's functional outcome.    REHAB POTENTIAL: {rehabpotential:25112}  CLINICAL DECISION MAKING: {clinical decision making:25114}  EVALUATION COMPLEXITY: {Evaluation complexity:25115}   GOALS: Goals reviewed with patient? {yes/no:20286}  SHORT TERM GOALS:  STG Name Target Date Goal status  1 *** Baseline:  {follow up:25551} {GOALSTATUS:25110}  2 *** Baseline:  {follow up:25551} {GOALSTATUS:25110}  3 *** Baseline: {follow up:25551} {GOALSTATUS:25110}   LONG TERM GOALS:   LTG Name Target Date Goal status  1 *** Baseline: {follow up:25551} {GOALSTATUS:25110}  2 *** Baseline: {follow up:25551} {GOALSTATUS:25110}  3 *** Baseline: {follow up:25551} {  GOALSTATUS:25110}  4 *** Baseline: {follow  up:25551} {GOALSTATUS:25110}    PLAN: PT FREQUENCY: {rehab frequency:25116}  PT DURATION: {rehab duration:25117}  PLANNED INTERVENTIONS: {rehab planned interventions:25118::"Therapeutic exercises","Therapeutic activity","Neuro Muscular re-education","Balance training","Gait training","Patient/Family education","Joint mobilization"}  PLAN FOR NEXT SESSION: ***   Bobette Mo, PT 10/13/2021, 9:37 AM

## 2021-10-19 DIAGNOSIS — K3184 Gastroparesis: Secondary | ICD-10-CM | POA: Diagnosis not present

## 2021-10-19 DIAGNOSIS — Z0000 Encounter for general adult medical examination without abnormal findings: Secondary | ICD-10-CM | POA: Diagnosis not present

## 2021-10-19 DIAGNOSIS — E1142 Type 2 diabetes mellitus with diabetic polyneuropathy: Secondary | ICD-10-CM | POA: Diagnosis not present

## 2021-10-19 DIAGNOSIS — I10 Essential (primary) hypertension: Secondary | ICD-10-CM | POA: Diagnosis not present

## 2021-10-19 DIAGNOSIS — E7800 Pure hypercholesterolemia, unspecified: Secondary | ICD-10-CM | POA: Diagnosis not present

## 2021-10-19 DIAGNOSIS — I25118 Atherosclerotic heart disease of native coronary artery with other forms of angina pectoris: Secondary | ICD-10-CM | POA: Diagnosis not present

## 2021-10-19 DIAGNOSIS — N1831 Chronic kidney disease, stage 3a: Secondary | ICD-10-CM | POA: Diagnosis not present

## 2021-10-25 ENCOUNTER — Ambulatory Visit: Payer: 59 | Admitting: Physical Therapy

## 2021-10-25 ENCOUNTER — Other Ambulatory Visit: Payer: Self-pay

## 2021-10-25 ENCOUNTER — Encounter: Payer: Self-pay | Admitting: Physical Therapy

## 2021-10-25 DIAGNOSIS — R2689 Other abnormalities of gait and mobility: Secondary | ICD-10-CM | POA: Diagnosis not present

## 2021-10-25 DIAGNOSIS — R600 Localized edema: Secondary | ICD-10-CM | POA: Diagnosis not present

## 2021-10-25 DIAGNOSIS — M25675 Stiffness of left foot, not elsewhere classified: Secondary | ICD-10-CM

## 2021-10-25 DIAGNOSIS — M6281 Muscle weakness (generalized): Secondary | ICD-10-CM | POA: Diagnosis not present

## 2021-10-25 DIAGNOSIS — M25572 Pain in left ankle and joints of left foot: Secondary | ICD-10-CM

## 2021-10-25 NOTE — Therapy (Signed)
?OUTPATIENT PHYSICAL THERAPY LOWER EXTREMITY EVALUATION ? ? ?Patient Name: Collin [name] ?MRN: [id number] ?DOB:October 28, 1950, 71 y.o., male ?Today's Date: 10/25/2021 ? ? PT End of Session - 10/25/21 1545   ? ? Visit Number 1   ? Number of Visits 15   ? Date for PT Re-Evaluation 12/20/21   ? PT Start Time 1430   ? PT Stop Time 1515   ? PT Time Calculation (min) 45 min   ? ?  ?  ? ?  ? ? ?DM with neuropathy ?Lt TKA ? ? ?PCP: Salli Real, MD ? ?REFERRING PROVIDER: Sharman Cheek, MD ? ?REFERRING DIAG: M21.6X2 (ICD-10-CM) - Other acquired deformities of left foot ?D74.128 (ICD-10-CM) - Primary osteoarthritis, left ankle and foot ? ?THERAPY DIAG:  ?Stiffness of left foot, not elsewhere classified ? ?Pain in left ankle and joints of left foot ? ?Other abnormalities of gait and mobility ? ?Muscle weakness (generalized) ? ?Localized edema ? ?ONSET DATE: 04/21/21 date of surgery ? ?SUBJECTIVE:  ? ?SUBJECTIVE STATEMENT: ?He relays pain, discomfort and swelling in his left foot that after left foot surgery. He says it has been worse over the last 2 months and limits his ability for standing and walking ? ?PERTINENT HISTORY: ?Left foot fusion triple arthrodesis DOS 04/21/2021 ? ?PAIN:  ?Are you having pain? Yes ?NPRS scale: 2-3/10 upon arrival but can get up to 9-10 ?Pain location: Lt foot ?PAIN TYPE: aching and sharp ?Pain description: constant  ?Aggravating factors: standing or walking more than 10 min, stairs ?Relieving factors: ice, pain meds ? ?PRECAUTIONS: None ? ?WEIGHT BEARING RESTRICTIONS No ? ?FALLS:  ?Has patient fallen in last 6 months? No, Number of falls: 0 ? ?LIVING ENVIRONMENT: ?Lives with: lives with their family ?He does have stairs and relays he tries to avoid them due to pain ? ?OCCUPATION: none ? ?PLOF: Independent with basic ADLs ? ?PATIENT GOALS : decreased pain and swelling in her left foot ? ? ?OBJECTIVE:  ? ?DIAGNOSTIC FINDINGS: XR from [facility] 08/26/21 Findings/impression:  ?Alignment: No dislocation. Pes  planovalgus.  ? ?Hardware: Status post subtalar, calcaneocuboid and talonavicular  ?arthrodesis. Hardware appears intact and in unchanged position compared to  ?prior. No new or increasing perihardware lucencies.  ? ?Bones: Osseous demineralization. No acutely displaced fracture identified.  ?Plantar calcaneal enthesophyte.  ? ?Joint spaces: Talonavicular, subtalar and calcaneocuboid articulations  ?remain visualized. 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  ? ?PATIENT SURVEYS:  ?FOTO 10/25/21  38% functional, goal is 57% ? ?COGNITION: ? Overall cognitive status: Within functional limits for tasks assessed   ?  ?SENSATION: ? Light touch: intact but deficits due to neuropathy ? ? 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 ?LE AROM/PROM: ? ?A/PROM Right ?10/25/2021 Left ?10/25/2021  ?Hip flexion    ?Hip extension    ?Hip abduction    ?Hip adduction    ?Hip internal rotation    ?Hip external rotation    ?Knee flexion    ?Knee extension    ?Ankle dorsiflexion  2/3  ?Ankle plantarflexion  30/40  ?Ankle inversion  5/10  ?Ankle eversion  5/10  ? (Blank rows = not tested) ? ?LE NOM:VEHMCN in sitting ? ?MMT Right ?10/25/2021 Left ?10/25/2021  ?Hip flexion    ?Hip extension    ?Hip abduction    ?Hip adduction    ?Hip internal rotation    ?Hip external rotation    ?Knee flexion    ?Knee extension    ?Ankle  dorsiflexion  5  ?Ankle plantarflexion  4+  ?Ankle inversion  4  ?Ankle eversion  4  ? (Blank rows = not tested) ? ? ?GAIT: ?Distance walked: 100 ?Assistive device utilized: None ?Level of assistance: Complete Independence ?Comments: pes planus and limited DF with excessive ER of left foot ? ?TODAY'S TREATMENT: ?PROM Left ankle to tolerance X , KT tape to left ankle for edema ?Vasopnuematic Left ankle 10 min, medium compression, 34 deg ?Reviewed and demonstrated each item on HEP listed below ? ? ?PATIENT EDUCATION:  ?Education details: HEP,POC,KT tape ?Person educated:  Patient ?Education method: Explanation, Demonstration, Verbal cues, and Handouts ?Education comprehension: verbalized understanding and needs further education ? ? ?HOME EXERCISE PROGRAM: ?Access Code: [email or web address] ?URL: [email or web address] ?Date: 10/25/2021 ?Prepared by: Ivery Quale ? ?Exercises ?Standing Gastroc Stretch at [employer] - 2 x daily - 6 x weekly - 1 sets - 3 reps - 30 hold ?Standing Soleus Stretch at Counter - 2 x daily - 6 x weekly - 1 sets - 3 reps - 30 hold ?Heel Toe Raises with Counter Support - 2 x daily - 6 x weekly - 2-3 sets - 10 reps ?Standing Dorsiflexion Self-Mobilization on Step - 2 x daily - 6 x weekly - 1 sets - 10 reps - 5-10 sec hold ?Arch Lifting - 2 x daily - 6 x weekly - 3 sets - 10 reps ?Standing Tandem Balance with Counter Support - 2 x daily - 6 x weekly - 1 sets - 3 reps - 30 hold ?Ankle Plantarflexion on Ball with Resistance - 2 x daily - 6 x weekly - 1-2 sets - 10 reps ?Seated Ankle Inversion with Resistance and Legs Crossed - 2 x daily - 6 x weekly - 1-2 sets - 10 reps ?Long Sitting Ankle Eversion with Resistance - 2 x daily - 6 x weekly - 1-2 sets - 10 reps ?Long Sitting Ankle Inversion with Resistance - 2 x daily - 6 x weekly - 1-2 sets - 10 reps ? ? ? ?ASSESSMENT: ? ?CLINICAL IMPRESSION: Patient presents with with Lt ankle pain and stiffness with difficulty walking and standing S/P left foot/ankle fusion triple arthrodesis ?DOS 04/21/2021. Patient will benefit from skilled PT to address below impairments and improve overall function. ? ?OBJECTIVE IMPAIRMENTS: decreased activity tolerance, difficulty walking, decreased balance, decreased endurance, decreased mobility, decreased ROM, decreased strength, impaired flexibility, impaired LE use, postural dysfunction, and pain. ? ?ACTIVITY LIMITATIONS: bending, lifting, carry, locomotion, cleaning, community activity, ADLs ? ?PERSONAL FACTORS: OA, chronic pain, DM with neuropathy are also affecting patient's functional  outcome. ? ? ?REHAB POTENTIAL: Good ? ?CLINICAL DECISION MAKING:  evolving/moderate ?EVALUATION COMPLEXITY: moderate ? ? ? ?GOALS: ?Short term PT Goals (target date for Short term goals are 4 weeks 11/22/21) ?Pt will be I and compliant with HEP. ?Baseline:  ?Goal status: New ?Pt will decrease pain by 25% overall ?Baseline: ?Goal status: New ? ?Long term PT goals (target dates for all long term goals are 8 weeks 12/20/21) ? ?Pt will improve  Lt ankle strength to at 5/5 MMT in supine to improve functional strength ?Baseline: ?Goal status: New ?Pt will improve FOTO to at least 57% functional to show improved function ?Baseline: ?Goal status: New ?Pt will reduce pain by overall 50% overall with standing activity ?Baseline:9/10 ?Goal status: New ?Pt will be able to ambulate community distances at least 600 ft and ambulate up/down stairs with no more than mild complaints ?Baseline: ?Goal status: New ? ?PLAN: ?PT FREQUENCY: 1-2 times per week  ? ?  PT DURATION: 6-8 weeks ? ?PLANNED INTERVENTIONS (unless contraindicated): aquatic PT, , cryotherapy, Electrical stimulation, Iontophoresis with 4 mg/ml dexamethasome, Moist heat, traction, Ultrasound, gait training, Therapeutic exercise, balance training, neuromuscular re-education, patient/family education, prosthetic training, manual techniques, passive ROM, dry needling, taping, vasopnuematic device, vestibular, spinal manipulations, joint manipulations ? ?PLAN FOR NEXT SESSION: how is HEP, how did KT tape do? Needs Lt ankle mobility and gentle progression for standing/walking tolerance. ? ?April Manson, PT ?10/25/2021, 3:56 PM ? ?

## 2021-11-04 ENCOUNTER — Encounter: Payer: 59 | Admitting: Physical Therapy

## 2021-11-04 ENCOUNTER — Telehealth: Payer: Self-pay | Admitting: Physical Therapy

## 2021-11-04 NOTE — Telephone Encounter (Signed)
Spoke to pt as we had him scheduled for PT at 1:45 today.  He reports he called to cancel the appointment as he is out of town and would not be back in time.  Appt canceled due to miscommunication.  Reminded of next scheduled PT appt. ? ?Clarita Crane, PT, DPT ?11/04/21 2:10 PM  ?

## 2021-11-04 NOTE — Therapy (Incomplete)
?OUTPATIENT PHYSICAL THERAPY TREATMENT NOTE ? ? ?Patient Name: Collin [name] ?MRN: [id number] ?DOB:09-15-1950, 71 y.o., male ?Today's Date: 11/04/2021 ? ?PCP: Salli Real, MD ?REFERRING PROVIDER: Salli Real, MD ? ? ? ?No past medical history on file. ?No past surgical history on file. ?There are no problems to display for this patient. ? ? ?REFERRING DIAG: M21.6X2 (ICD-10-CM) - Other acquired deformities of left foot ?I50.277 (ICD-10-CM) - Primary osteoarthritis, left ankle and foot ? ?THERAPY DIAG:  ?No diagnosis found. ? ?PERTINENT HISTORY: Left foot fusion triple arthrodesis DOS 04/21/2021 ? ?PRECAUTIONS: none ? ?SUBJECTIVE: *** ? ?PAIN:  ?Are you having pain? {OPRCPAIN:[id number]} ? ? ? ? ?OBJECTIVE:  ?  ?PATIENT SURVEYS:  ?FOTO 10/25/21  38% functional, goal is 57% ?  ?  ?LE AROM/PROM: ?  ?A/PROM Right ?10/25/2021 Left ?10/25/2021  ?Hip flexion      ?Hip extension      ?Hip abduction      ?Hip adduction      ?Hip internal rotation      ?Hip external rotation      ?Knee flexion      ?Knee extension      ?Ankle dorsiflexion   2/3  ?Ankle plantarflexion   30/40  ?Ankle inversion   5/10  ?Ankle eversion   5/10  ? (Blank rows = not tested) ?  ?LE AJO:INOMVE in sitting ?  ?MMT Right ?10/25/2021 Left ?10/25/2021  ?Hip flexion      ?Hip extension      ?Hip abduction      ?Hip adduction      ?Hip internal rotation      ?Hip external rotation      ?Knee flexion      ?Knee extension      ?Ankle dorsiflexion   5  ?Ankle plantarflexion   4+  ?Ankle inversion   4  ?Ankle eversion   4  ? (Blank rows = not tested) ?  ?  ?GAIT: ?Distance walked: 100 ?Assistive device utilized: None ?Level of assistance: Complete Independence ?Comments: pes planus and limited DF with excessive ER of left foot ?  ?TODAY'S TREATMENT: ?11/04/21 ?*** ? ?10/25/21: PROM Left ankle to tolerance X , KT tape to left ankle for edema ?Vasopnuematic Left ankle 10 min, medium compression, 34 deg ?Reviewed and demonstrated each item on HEP listed below ?  ?  ?PATIENT EDUCATION:   ?Education details: HEP,POC,KT tape ?Person educated: Patient ?Education method: Explanation, Demonstration, Verbal cues, and Handouts ?Education comprehension: verbalized understanding and needs further education ?  ?  ?HOME EXERCISE PROGRAM: ?Access Code: [email or web address] ?URL: [email or web address] ?Date: 10/25/2021 ?Prepared by: Ivery Quale ?  ?Exercises ?Standing Gastroc Stretch at [employer] - 2 x daily - 6 x weekly - 1 sets - 3 reps - 30 hold ?Standing Soleus Stretch at Counter - 2 x daily - 6 x weekly - 1 sets - 3 reps - 30 hold ?Heel Toe Raises with Counter Support - 2 x daily - 6 x weekly - 2-3 sets - 10 reps ?Standing Dorsiflexion Self-Mobilization on Step - 2 x daily - 6 x weekly - 1 sets - 10 reps - 5-10 sec hold ?Arch Lifting - 2 x daily - 6 x weekly - 3 sets - 10 reps ?Standing Tandem Balance with Counter Support - 2 x daily - 6 x weekly - 1 sets - 3 reps - 30 hold ?Ankle Plantarflexion on Ball with Resistance - 2 x daily - 6 x weekly - 1-2 sets - 10 reps ?Seated Ankle Inversion  with Resistance and Legs Crossed - 2 x daily - 6 x weekly - 1-2 sets - 10 reps ?Long Sitting Ankle Eversion with Resistance - 2 x daily - 6 x weekly - 1-2 sets - 10 reps ?Long Sitting Ankle Inversion with Resistance - 2 x daily - 6 x weekly - 1-2 sets - 10 reps ?  ?  ?  ?ASSESSMENT: ?  ?CLINICAL IMPRESSION:  ?*** ?Patient presents with with Lt ankle pain and stiffness with difficulty walking and standing S/P left foot/ankle fusion triple arthrodesis ?DOS 04/21/2021. Patient will benefit from skilled PT to address below impairments and improve overall function. ?  ?OBJECTIVE IMPAIRMENTS: decreased activity tolerance, difficulty walking, decreased balance, decreased endurance, decreased mobility, decreased ROM, decreased strength, impaired flexibility, impaired LE use, postural dysfunction, and pain. ?  ?ACTIVITY LIMITATIONS: bending, lifting, carry, locomotion, cleaning, community activity, ADLs ?  ?PERSONAL FACTORS: OA,  chronic pain, DM with neuropathy are also affecting patient's functional outcome. ?  ?  ?REHAB POTENTIAL: Good ?  ?CLINICAL DECISION MAKING:  evolving/moderate ?EVALUATION COMPLEXITY: moderate ?  ?  ?  ?GOALS: ?Short term PT Goals (target date for Short term goals are 4 weeks 11/22/21) ?Pt will be I and compliant with HEP. ?Baseline:  ?Goal status: New ?Pt will decrease pain by 25% overall ?Baseline: ?Goal status: New ?  ?Long term PT goals (target dates for all long term goals are 8 weeks 12/20/21) ?  ?Pt will improve  Lt ankle strength to at 5/5 MMT in supine to improve functional strength ?Baseline: ?Goal status: New ?Pt will improve FOTO to at least 57% functional to show improved function ?Baseline: ?Goal status: New ?Pt will reduce pain by overall 50% overall with standing activity ?Baseline:9/10 ?Goal status: New ?Pt will be able to ambulate community distances at least 600 ft and ambulate up/down stairs with no more than mild complaints ?Baseline: ?Goal status: New ?  ?PLAN: ?PT FREQUENCY: 1-2 times per week  ?  ?PT DURATION: 6-8 weeks ?  ?PLANNED INTERVENTIONS (unless contraindicated): aquatic PT, , cryotherapy, Electrical stimulation, Iontophoresis with 4 mg/ml dexamethasome, Moist heat, traction, Ultrasound, gait training, Therapeutic exercise, balance training, neuromuscular re-education, patient/family education, prosthetic training, manual techniques, passive ROM, dry needling, taping, vasopnuematic device, vestibular, spinal manipulations, joint manipulations ?  ?PLAN FOR NEXT SESSION: *** how is HEP, how did KT tape do? Needs Lt ankle mobility and gentle progression for standing/walking tolerance. ? ? ? ?Moshe Cipro, PT ?11/04/2021, 1:16 PM ? ?  ? ?

## 2021-11-05 ENCOUNTER — Ambulatory Visit (INDEPENDENT_AMBULATORY_CARE_PROVIDER_SITE_OTHER): Payer: 59 | Admitting: Physical Therapy

## 2021-11-05 ENCOUNTER — Encounter: Payer: Self-pay | Admitting: Physical Therapy

## 2021-11-05 ENCOUNTER — Other Ambulatory Visit: Payer: Self-pay

## 2021-11-05 DIAGNOSIS — M25675 Stiffness of left foot, not elsewhere classified: Secondary | ICD-10-CM

## 2021-11-05 DIAGNOSIS — R600 Localized edema: Secondary | ICD-10-CM

## 2021-11-05 DIAGNOSIS — M25572 Pain in left ankle and joints of left foot: Secondary | ICD-10-CM | POA: Diagnosis not present

## 2021-11-05 DIAGNOSIS — R2689 Other abnormalities of gait and mobility: Secondary | ICD-10-CM

## 2021-11-05 DIAGNOSIS — M6281 Muscle weakness (generalized): Secondary | ICD-10-CM | POA: Diagnosis not present

## 2021-11-05 NOTE — Therapy (Signed)
?OUTPATIENT PHYSICAL THERAPY TREATMENT NOTE ? ? ?Patient Name: Collin [name] ?MRN: [id number] ?DOB:September 29, 1950, 71 y.o., male ?Today's Date: 11/05/2021 ? ?PCP: Salli Real, MD ?REFERRING PROVIDER: Johnsie Kindred, MD ? ? PT End of Session - 11/05/21 1021   ? ? Visit Number 2   ? Number of Visits 15   ? Date for PT Re-Evaluation 12/20/21   ? PT Start Time 1015   ? PT Stop Time 1105   ? PT Time Calculation (min) 50 min   ? ?  ?  ? ?  ? ? ?History reviewed. No pertinent past medical history. ?History reviewed. No pertinent surgical history. ?There are no problems to display for this patient. ? ? ? ?THERAPY DIAG:  ?Stiffness of left foot, not elsewhere classified ? ?Pain in left ankle and joints of left foot ? ?Other abnormalities of gait and mobility ? ?Muscle weakness (generalized) ? ?Localized edema ? ?REFERRING DIAG: M21.6X2 (ICD-10-CM) - Other acquired deformities of left foot ?J24.268 (ICD-10-CM) - Primary osteoarthritis, left ankle and foot ?  ?PCP: Salli Real, MD ?  ?REFERRING PROVIDER: Sharman Cheek, MD ?  ?ONSET DATE: Left foot fusion triple arthrodesis DOS 04/21/2021 ? ?  ?SUBJECTIVE:  ?  ?SUBJECTIVE STATEMENT: ?He relays pain is much better than last time and he has been doing HEP. ? ?  ?PAIN:  ?Are you having pain? Yes ?NPRS scale: 1/10 upon arrival  ?Pain location: Lt foot ?PAIN TYPE: aching and sharp ?Pain description: constant  ?Aggravating factors: standing or walking more than 10 min, stairs ?Relieving factors: ice, pain meds ?  ?PRECAUTIONS: None ?  ?WEIGHT BEARING RESTRICTIONS No ?  ?PATIENT GOALS : decreased pain and swelling in her left foot ?  ?  ?OBJECTIVE:  ?  ?DIAGNOSTIC FINDINGS: XR from [facility] 08[facility]26/21 Findings/impression:  ?Alignment: No dislocation. Pes planovalgus.  ? ?Hardware: Status post subtalar, calcaneocuboid and talonavicular  ?arthrodesis. Hardware appears intact and in unchanged position compared to  ?prior. No new or increasing perihardware lucencies.  ? ?Bones: Osseous  demineralization. No acutely displaced fracture identified.  ?Plantar calcaneal enthesophyte.  ? ?Joint spaces: Talonavicular, subtalar and calcaneocuboid articulations  ?remain visualized. 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  ?  ?PATIENT SURVEYS:  ?FOTO 10/25/21  38% functional, goal is 57% ?  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  ?LE AROM/PROM: ?  ?A/PROM Right ?10/25/2021 Left ?10/25/2021  ?Hip flexion      ?Hip extension      ?Hip abduction      ?Hip adduction      ?Hip internal rotation      ?Hip external rotation      ?Knee flexion      ?Knee extension      ?Ankle dorsiflexion   2/3  ?Ankle plantarflexion   30/40  ?Ankle inversion   5/10  ?Ankle eversion   5/10  ? (Blank rows = not tested) ?  ?LE TMH:DQQIWL in sitting ?  ?MMT Right ?10/25/2021 Left ?10/25/2021  ?Hip flexion      ?Hip extension      ?Hip abduction      ?Hip adduction      ?Hip internal rotation      ?Hip external rotation      ?Knee flexion      ?Knee extension      ?Ankle dorsiflexion   5  ?Ankle plantarflexion   4+  ?Ankle inversion   4  ?Ankle eversion   4  ? (  Blank rows = not tested) ?  ?  ?GAIT: ?11/05/21 ?Distance walked: 200 ?Assistive device utilized: None ?Level of assistance: Complete Independence ?Comments: pes planus and limited DF and ER of left foot however this has improved since last visit ? ?10/25/21 ?Distance walked: 100 ?Assistive device utilized: None ?Level of assistance: Complete Independence ?Comments: pes planus and limited DF with excessive ER of left foot ?  ?TODAY'S TREATMENT: ? ?11/05/21 ?Therapeutic Exercise: ? Aerobic:Nu step L6 X 6 min UE/LE ?Supine:Ankle 4 way green X 15 ea ?Prone: ? Seated:black rocker board 2 min lateral, 2 min A-P ? Standing:Gastroc stretch and soleus stretch 30 sec X 3 bilat on slantboard. Ankle DF lunge stretch with Left foot on slantboard 5 sec X 15. Heel and toe raises 2x10 bilat. Tandem balance with left leg behind 30 sec X  3 ?Neuromuscular Re-education: ?Manual Therapy: ?Therapeutic Activity: ?Self Care: ?Trigger Point Dry Needling:  ?Modalities: Vasopnuematic Left ankle 10 min, high compression, 34 deg ? ?10/25/21 ?PROM Left ankle to tolerance X 5min, KT tape to left ankle for edema ?Vasopnuematic Left ankle 10 min, medium compression, 34 deg ?Reviewed and demonstrated each item on HEP listed below ?  ?  ?PATIENT EDUCATION:  ?Education details: HEP,POC,KT tape ?Person educated: Patient ?Education method: Explanation, Demonstration, Verbal cues, and Handouts ?Education comprehension: verbalized understanding and needs further education ?  ?  ?HOME EXERCISE PROGRAM: ?Access Code: [email or web address] ?URL: [email or web address] ?Date: 10/25/2021 ?Prepared by: Ivery QualeBrian Jaclyn Andy ?  ?Exercises ?Standing Gastroc Stretch at [employer] - 2 x daily - 6 x weekly - 1 sets - 3 reps - 30 hold ?Standing Soleus Stretch at Counter - 2 x daily - 6 x weekly - 1 sets - 3 reps - 30 hold ?Heel Toe Raises with Counter Support - 2 x daily - 6 x weekly - 2-3 sets - 10 reps ?Standing Dorsiflexion Self-Mobilization on Step - 2 x daily - 6 x weekly - 1 sets - 10 reps - 5-10 sec hold ?Arch Lifting - 2 x daily - 6 x weekly - 3 sets - 10 reps ?Standing Tandem Balance with Counter Support - 2 x daily - 6 x weekly - 1 sets - 3 reps - 30 hold ?Ankle Plantarflexion on Ball with Resistance - 2 x daily - 6 x weekly - 1-2 sets - 10 reps ?Seated Ankle Inversion with Resistance and Legs Crossed - 2 x daily - 6 x weekly - 1-2 sets - 10 reps ?Long Sitting Ankle Eversion with Resistance - 2 x daily - 6 x weekly - 1-2 sets - 10 reps ?Long Sitting Ankle Inversion with Resistance - 2 x daily - 6 x weekly - 1-2 sets - 10 reps ?  ?  ?  ?ASSESSMENT: ?  ?CLINICAL IMPRESSION: He has made great progress since last visit in his weight bearing tolerance, gait and overall ROM for his Lt ankle. He is still limited to only about 15 minutes of standing/walking tolerance and still with Lt ankle  hypomobility thus will continue to benefit from PT. ? ?OBJECTIVE IMPAIRMENTS: decreased activity tolerance, difficulty walking, decreased balance, decreased endurance, decreased mobility, decreased ROM, decreased strength, impaired flexibility, impaired LE use, postural dysfunction, and pain. ?  ?ACTIVITY LIMITATIONS: bending, lifting, carry, locomotion, cleaning, community activity, ADLs ?  ?PERSONAL FACTORS: OA, chronic pain, DM with neuropathy are also affecting patient's functional outcome. ?  ?  ?REHAB POTENTIAL: Good ?  ?CLINICAL DECISION MAKING:  evolving/moderate ?EVALUATION COMPLEXITY: moderate ?  ?  ?  ?GOALS: ?Short term PT  Goals (target date for Short term goals are 4 weeks 11/22/21) ?Pt will be I and compliant with HEP. ?Baseline:  ?Goal status: New ?Pt will decrease pain by 25% overall ?Baseline: ?Goal status: New ?  ?Long term PT goals (target dates for all long term goals are 8 weeks 12/20/21) ?  ?Pt will improve  Lt ankle strength to at 5/5 MMT in supine to improve functional strength ?Baseline: ?Goal status: New ?Pt will improve FOTO to at least 57% functional to show improved function ?Baseline: ?Goal status: New ?Pt will reduce pain by overall 50% overall with standing activity ?Baseline:9/10 ?Goal status: New ?Pt will be able to ambulate community distances at least 600 ft and ambulate up/down stairs with no more than mild complaints ?Baseline: ?Goal status: New ?  ?PLAN: ?PT FREQUENCY: 1-2 times per week  ?  ?PT DURATION: 6-8 weeks ?  ?PLANNED INTERVENTIONS (unless contraindicated): aquatic PT, , cryotherapy, Electrical stimulation, Iontophoresis with 4 mg/ml dexamethasome, Moist heat, traction, Ultrasound, gait training, Therapeutic exercise, balance training, neuromuscular re-education, patient/family education, prosthetic training, manual techniques, passive ROM, dry needling, taping, vasopnuematic device, vestibular, spinal manipulations, joint manipulations ?  ?PLAN FOR NEXT SESSION: Needs Lt  ankle mobility and gentle progression for standing/walking tolerance. Vaso ifor edema if desired. ? ? ?April Manson, PT,DPT ?11/05/2021, 10:59 AM ? ?  ? ?

## 2021-11-08 ENCOUNTER — Encounter: Payer: Self-pay | Admitting: Physical Therapy

## 2021-11-08 ENCOUNTER — Ambulatory Visit (INDEPENDENT_AMBULATORY_CARE_PROVIDER_SITE_OTHER): Payer: 59 | Admitting: Physical Therapy

## 2021-11-08 ENCOUNTER — Other Ambulatory Visit: Payer: Self-pay

## 2021-11-08 DIAGNOSIS — M25572 Pain in left ankle and joints of left foot: Secondary | ICD-10-CM | POA: Diagnosis not present

## 2021-11-08 DIAGNOSIS — M6281 Muscle weakness (generalized): Secondary | ICD-10-CM | POA: Diagnosis not present

## 2021-11-08 DIAGNOSIS — R600 Localized edema: Secondary | ICD-10-CM | POA: Diagnosis not present

## 2021-11-08 DIAGNOSIS — R2689 Other abnormalities of gait and mobility: Secondary | ICD-10-CM | POA: Diagnosis not present

## 2021-11-08 DIAGNOSIS — M25675 Stiffness of left foot, not elsewhere classified: Secondary | ICD-10-CM

## 2021-11-08 NOTE — Therapy (Signed)
?OUTPATIENT PHYSICAL THERAPY TREATMENT NOTE ? ? ?Patient Name: Collin [name] ?MRN: [id number] ?DOB:02/26/1951, 71 y.o., male ?Today's Date: 11/08/2021 ? ?PCP: Sandi Mariscal, MD ?REFERRING PROVIDER: Margret Chance, MD ? ? PT End of Session - 11/08/21 6384   ? ? Visit Number 3   ? Number of Visits 15   ? Date for PT Re-Evaluation 12/20/21   ? PT Start Time 0845   ? PT Stop Time 0930   ? PT Time Calculation (min) 45 min   ? ?  ?  ? ?  ? ? ?History reviewed. No pertinent past medical history. ?History reviewed. No pertinent surgical history. ?There are no problems to display for this patient. ? ? ? ?THERAPY DIAG:  ?Stiffness of left foot, not elsewhere classified ? ?Pain in left ankle and joints of left foot ? ?Other abnormalities of gait and mobility ? ?Muscle weakness (generalized) ? ?Localized edema ? ?REFERRING DIAG: M21.6X2 (ICD-10-CM) - Other acquired deformities of left foot ?Y65.993 (ICD-10-CM) - Primary osteoarthritis, left ankle and foot ?  ?PCP: Sandi Mariscal, MD ?  ?REFERRING PROVIDER: Nelle Don, MD ?  ?ONSET DATE: Left foot fusion triple arthrodesis DOS 04/21/2021 ? ?  ?SUBJECTIVE:  ?  ?SUBJECTIVE STATEMENT: ?He relays pain is doing well, he was not too sore after last time. He does still have swelling with activity such as going to supermarket. ? ?  ?PAIN:  ?Are you having pain? Yes ?NPRS scale: 1/10 upon arrival  ?Pain location: Lt foot ?PAIN TYPE: aching and sharp ?Pain description: constant  ?Aggravating factors: standing or walking more than 10 min, stairs ?Relieving factors: ice, pain meds ?  ?PRECAUTIONS: None ?  ?WEIGHT BEARING RESTRICTIONS No ?  ?PATIENT GOALS : decreased pain and swelling in her left foot ?  ?  ?OBJECTIVE:  ?  ?DIAGNOSTIC FINDINGS: XR from [facility] 08/26/21 Findings/impression:  ?Alignment: No dislocation. Pes planovalgus.  ? ?Hardware: Status post subtalar, calcaneocuboid and talonavicular  ?arthrodesis. Hardware appears intact and in unchanged position compared to  ?prior. No new  or increasing perihardware lucencies.  ? ?Bones: Osseous demineralization. No acutely displaced fracture identified.  ?Plantar calcaneal enthesophyte.  ? ?Joint spaces: Talonavicular, subtalar and calcaneocuboid articulations  ?remain visualized. 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  ?  ?PATIENT SURVEYS:  ?FOTO 10/25/21  38% functional, goal is 57% ?  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  ?LE AROM/PROM: ?  ?A/PROM Right ?10/25/2021 Left ?10/25/2021  ?Hip flexion      ?Hip extension      ?Hip abduction      ?Hip adduction      ?Hip internal rotation      ?Hip external rotation      ?Knee flexion      ?Knee extension      ?Ankle dorsiflexion   2/3  ?Ankle plantarflexion   30/40  ?Ankle inversion   5/10  ?Ankle eversion   5/10  ? (Blank rows = not tested) ?  ?LE TTS:[id number] in sitting ?  ?MMT Right ?10/25/2021 Left ?10/25/2021  ?Hip flexion      ?Hip extension      ?Hip abduction      ?Hip adduction      ?Hip internal rotation      ?Hip external rotation      ?Knee flexion      ?Knee extension      ?Ankle dorsiflexion   5  ?Ankle plantarflexion   4+  ?  Ankle inversion   4  ?Ankle eversion   4  ? (Blank rows = not tested) ?  ?  ?GAIT: ?11/05/21 ?Distance walked: 200 ?Assistive device utilized: None ?Level of assistance: Complete Independence ?Comments: pes planus and limited DF and ER of left foot however this has improved since last visit ? ?10/25/21 ?Distance walked: 100 ?Assistive device utilized: None ?Level of assistance: Complete Independence ?Comments: pes planus and limited DF with excessive ER of left foot ?  ?TODAY'S TREATMENT: ? ?11/08/21 ?Therapeutic Exercise: ? Aerobic: ?Supine:Ankle 4 way green X 20 ea ?Prone: ? Seated:circular rocker board Lt ankle 2 min lateral, 2 min A-P, 2 min circles CW and CCW. ?Standing:Gastroc stretch and soleus stretch 60 sec X 2 bilat on slantboard. Heel raises off foam beam 2x10 bilat. Tandem walk X 3 round trips in bars. walking  lateral on foam beam 5 round trips in bars. Lateral step down keeping Lt foot on 6 inch step X15. ?Neuromuscular Re-education: ?Manual Therapy: ?Therapeutic Activity: ?Self Care: ?Trigger Point Dry Needling:  ?Modalities: Vasopnuematic Left ankle 10 min, high compression, 34 deg ? ?11/05/21 ?Therapeutic Exercise: ? Aerobic:Nu step L6 X 6 min UE/LE ?Supine:Ankle 4 way green X 15 ea ?Prone: ? Seated:black rocker board 2 min lateral, 2 min A-P ? Standing:Gastroc stretch and soleus stretch 30 sec X 3 bilat on slantboard. Ankle DF lunge stretch with Left foot on slantboard 5 sec X 15. Heel and toe raises 2x10 bilat. Tandem balance with left leg behind 30 sec X 3 ?Neuromuscular Re-education: ?Manual Therapy: ?Therapeutic Activity: ?Self Care: ?Trigger Point Dry Needling:  ?Modalities: Vasopnuematic Left ankle 10 min, high compression, 34 deg ? ?  ?  ?PATIENT EDUCATION:  ?Education details: HEP,POC,KT tape ?Person educated: Patient ?Education method: Explanation, Demonstration, Verbal cues, and Handouts ?Education comprehension: verbalized understanding and needs further education ?  ?  ?HOME EXERCISE PROGRAM: ?Access Code: [email or web address] ?URL: [email or web address] ?Date: 10/25/2021 ?Prepared by: Elsie Ra ?  ?Exercises ?Standing Gastroc Stretch at [employer] - 2 x daily - 6 x weekly - 1 sets - 3 reps - 30 hold ?Standing Soleus Stretch at Counter - 2 x daily - 6 x weekly - 1 sets - 3 reps - 30 hold ?Heel Toe Raises with Counter Support - 2 x daily - 6 x weekly - 2-3 sets - 10 reps ?Standing Dorsiflexion Self-Mobilization on Step - 2 x daily - 6 x weekly - 1 sets - 10 reps - 5-10 sec hold ?Arch Lifting - 2 x daily - 6 x weekly - 3 sets - 10 reps ?Standing Tandem Balance with Counter Support - 2 x daily - 6 x weekly - 1 sets - 3 reps - 30 hold ?Ankle Plantarflexion on Ball with Resistance - 2 x daily - 6 x weekly - 1-2 sets - 10 reps ?Seated Ankle Inversion with Resistance and Legs Crossed - 2 x daily - 6 x weekly -  1-2 sets - 10 reps ?Long Sitting Ankle Eversion with Resistance - 2 x daily - 6 x weekly - 1-2 sets - 10 reps ?Long Sitting Ankle Inversion with Resistance - 2 x daily - 6 x weekly - 1-2 sets - 10 reps ?  ?  ?  ?ASSESSMENT: ?  ?CLINICAL IMPRESSION: He is overall improving with strength, ROM, and standing activity tolerance but still with some limitations. He shows good tolerance to exercise progressions. He has now met his short term PT goals. Continue POC. ? ?OBJECTIVE IMPAIRMENTS: decreased activity tolerance, difficulty walking, decreased balance, decreased  endurance, decreased mobility, decreased ROM, decreased strength, impaired flexibility, impaired LE use, postural dysfunction, and pain. ?  ?ACTIVITY LIMITATIONS: bending, lifting, carry, locomotion, cleaning, community activity, ADLs ?  ?PERSONAL FACTORS: OA, chronic pain, DM with neuropathy are also affecting patient's functional outcome. ?  ?  ?REHAB POTENTIAL: Good ?  ?CLINICAL DECISION MAKING:  evolving/moderate ?EVALUATION COMPLEXITY: moderate ?  ?  ?  ?GOALS: ?Short term PT Goals (target date for Short term goals are 4 weeks 11/22/21) ?Pt will be I and compliant with HEP. ?Baseline:  ?Goal status: Met 11/08/21 ?Pt will decrease pain by 25% overall ?Baseline: ?Goal status: Met 11/08/21 ?  ?Long term PT goals (target dates for all long term goals are 8 weeks 12/20/21) ?  ?Pt will improve  Lt ankle strength to at 5/5 MMT in supine to improve functional strength ?Baseline: ?Goal status: New ?Pt will improve FOTO to at least 57% functional to show improved function ?Baseline: ?Goal status: New ?Pt will reduce pain by overall 50% overall with standing activity ?Baseline:9/10 ?Goal status: New ?Pt will be able to ambulate community distances at least 600 ft and ambulate up/down stairs with no more than mild complaints ?Baseline: ?Goal status: New ?  ?PLAN: ?PT FREQUENCY: 1-2 times per week  ?  ?PT DURATION: 6-8 weeks ?  ?PLANNED INTERVENTIONS (unless  contraindicated): aquatic PT, , cryotherapy, Electrical stimulation, Iontophoresis with 4 mg/ml dexamethasome, Moist heat, traction, Ultrasound, gait training, Therapeutic exercise, balance training, neuromuscular

## 2021-11-11 ENCOUNTER — Ambulatory Visit (INDEPENDENT_AMBULATORY_CARE_PROVIDER_SITE_OTHER): Payer: 59 | Admitting: Physical Therapy

## 2021-11-11 ENCOUNTER — Encounter: Payer: Self-pay | Admitting: Physical Therapy

## 2021-11-11 ENCOUNTER — Other Ambulatory Visit: Payer: Self-pay

## 2021-11-11 DIAGNOSIS — M25572 Pain in left ankle and joints of left foot: Secondary | ICD-10-CM

## 2021-11-11 DIAGNOSIS — M6281 Muscle weakness (generalized): Secondary | ICD-10-CM | POA: Diagnosis not present

## 2021-11-11 DIAGNOSIS — M25675 Stiffness of left foot, not elsewhere classified: Secondary | ICD-10-CM | POA: Diagnosis not present

## 2021-11-11 DIAGNOSIS — R600 Localized edema: Secondary | ICD-10-CM

## 2021-11-11 DIAGNOSIS — R2689 Other abnormalities of gait and mobility: Secondary | ICD-10-CM | POA: Diagnosis not present

## 2021-11-11 NOTE — Therapy (Signed)
?OUTPATIENT PHYSICAL THERAPY TREATMENT NOTE ? ? ?Patient Name: Collin [name] ?MRN: [id number] ?DOB:10-25-1950, 71 y.o., male ?Today's Date: 11/11/2021 ? ?PCP: Sandi Mariscal, MD ?REFERRING PROVIDER: Margret Chance, MD ? ? PT End of Session - 11/11/21 0956   ? ? Visit Number 4   ? Number of Visits 15   ? Date for PT Re-Evaluation 12/20/21   ? PT Start Time 9417   ? PT Stop Time 1015   ? PT Time Calculation (min) 40 min   ? ?  ?  ? ?  ? ? ? ?History reviewed. No pertinent past medical history. ?History reviewed. No pertinent surgical history. ?There are no problems to display for this patient. ? ? ? ?THERAPY DIAG:  ?Stiffness of left foot, not elsewhere classified ? ?Pain in left ankle and joints of left foot ? ?Other abnormalities of gait and mobility ? ?Muscle weakness (generalized) ? ?Localized edema ? ?REFERRING DIAG: M21.6X2 (ICD-10-CM) - Other acquired deformities of left foot ?E08.144 (ICD-10-CM) - Primary osteoarthritis, left ankle and foot ?  ?PCP: Sandi Mariscal, MD ?  ?REFERRING PROVIDER: Nelle Don, MD ?  ?ONSET DATE: Left foot fusion triple arthrodesis DOS 04/21/2021 ? ?  ?SUBJECTIVE:  ?  ?SUBJECTIVE STATEMENT: ?He relays he twisted his ankle walking in yard yesterday so more pain from this ? ?  ?PAIN:  ?Are you having pain? Yes ?NPRS scale: 4/10 upon arrival  ?Pain location: Lt foot ?PAIN TYPE: aching and sharp ?Pain description: constant  ?Aggravating factors: standing or walking more than 10 min, stairs ?Relieving factors: ice, pain meds ?  ?PRECAUTIONS: None ?  ?WEIGHT BEARING RESTRICTIONS No ?  ?PATIENT GOALS : decreased pain and swelling in her left foot ?  ?  ?OBJECTIVE:  ?  ?DIAGNOSTIC FINDINGS: XR from [facility] 08/26/21 Findings/impression:  ?Alignment: No dislocation. Pes planovalgus.  ? ?Hardware: Status post subtalar, calcaneocuboid and talonavicular  ?arthrodesis. Hardware appears intact and in unchanged position compared to  ?prior. No new or increasing perihardware lucencies.  ? ?Bones: Osseous  demineralization. No acutely displaced fracture identified.  ?Plantar calcaneal enthesophyte.  ? ?Joint spaces: Talonavicular, subtalar and calcaneocuboid articulations  ?remain visualized. 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  ?  ?PATIENT SURVEYS:  ?FOTO 10/25/21  38% functional, goal is 57% ?  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  ?LE AROM/PROM: ?  ?A/PROM Right ?10/25/2021 Left ?10/25/2021 Left  ?11/11/21  ?Hip flexion       ?Hip extension       ?Hip abduction       ?Hip adduction       ?Hip internal rotation       ?Hip external rotation       ?Knee flexion       ?Knee extension       ?Ankle dorsiflexion   2/3 4/6  ?Ankle plantarflexion   30/40 40/  ?Ankle inversion   5/10 10/  ?Ankle eversion   5/10 10/  ? (Blank rows = not tested) ?  ?LE YJE:HUDJSH in sitting ?  ?MMT Right ?10/25/2021 Left ?10/25/2021  ?Hip flexion      ?Hip extension      ?Hip abduction      ?Hip adduction      ?Hip internal rotation      ?Hip external rotation      ?Knee flexion      ?Knee extension      ?Ankle dorsiflexion   5  ?Ankle  plantarflexion   4+  ?Ankle inversion   4  ?Ankle eversion   4  ? (Blank rows = not tested) ?  ?  ?GAIT: ?11/05/21 ?Distance walked: 200 ?Assistive device utilized: None ?Level of assistance: Complete Independence ?Comments: pes planus and limited DF and ER of left foot however this has improved since last visit ? ?10/25/21 ?Distance walked: 100 ?Assistive device utilized: None ?Level of assistance: Complete Independence ?Comments: pes planus and limited DF with excessive ER of left foot ?  ?TODAY'S TREATMENT: ?11/11/21 ?Therapeutic Exercise: ? Aerobic:Nu step L7 X 6 min LE only ?Supine:Ankle 4 way green X 15 ea ?Prone: ? Seated:round rocker board Lt ankle A-P, lateral, and circles CW and CCW X 20 ea ? Standing:Gastroc stretch and soleus stretch 30 sec X 3 bilat on slantboard. Heel and toe raises 2x10 bilat. Tandem balance with left leg behind 30 sec X  3 ?Neuromuscular Re-education: ?Manual Therapy: ?Therapeutic Activity: ?Self Care: ?Trigger Point Dry Needling:  ?Modalities: Vasopnuematic Left ankle 10 min, high compression, 34 deg ? ?11/08/21 ?Therapeutic Exercise: ? Aerobic: ?Supine:Ankle 4 way green X 20 ea ?Prone: ? Seated:circular rocker board Lt ankle 2 min lateral, 2 min A-P, 2 min circles CW and CCW. ?Standing:Gastroc stretch and soleus stretch 60 sec X 2 bilat on slantboard. Heel raises off foam beam 2x10 bilat. Tandem walk X 3 round trips in bars. walking lateral on foam beam 5 round trips in bars. Lateral step down keeping Lt foot on 6 inch step X15. ?Neuromuscular Re-education: ?Manual Therapy: ?Therapeutic Activity: ?Self Care: ?Trigger Point Dry Needling:  ?Modalities: Vasopnuematic Left ankle 10 min, high compression, 34 deg ? ?11/05/21 ?Therapeutic Exercise: ? Aerobic:Nu step L6 X 6 min UE/LE ?Supine:Ankle 4 way green X 15 ea ?Prone: ? Seated:black rocker board 2 min lateral, 2 min A-P ? Standing:Gastroc stretch and soleus stretch 30 sec X 3 bilat on slantboard. Ankle DF lunge stretch with Left foot on slantboard 5 sec X 15. Heel and toe raises 2x10 bilat. Tandem balance with left leg behind 30 sec X 3 ?Neuromuscular Re-education: ?Manual Therapy: ?Therapeutic Activity: ?Self Care: ?Trigger Point Dry Needling:  ?Modalities: Vasopnuematic Left ankle 10 min, high compression, 34 deg ? ?  ?  ?PATIENT EDUCATION:  ?Education details: HEP,POC,KT tape ?Person educated: Patient ?Education method: Explanation, Demonstration, Verbal cues, and Handouts ?Education comprehension: verbalized understanding and needs further education ?  ?  ?HOME EXERCISE PROGRAM: ?Access Code: [email or web address] ?URL: [email or web address] ?Date: 10/25/2021 ?Prepared by: Elsie Ra ?  ?Exercises ?Standing Gastroc Stretch at [employer] - 2 x daily - 6 x weekly - 1 sets - 3 reps - 30 hold ?Standing Soleus Stretch at Counter - 2 x daily - 6 x weekly - 1 sets - 3 reps - 30  hold ?Heel Toe Raises with Counter Support - 2 x daily - 6 x weekly - 2-3 sets - 10 reps ?Standing Dorsiflexion Self-Mobilization on Step - 2 x daily - 6 x weekly - 1 sets - 10 reps - 5-10 sec hold ?Arch Lifting - 2 x daily - 6 x weekly - 3 sets - 10 reps ?Standing Tandem Balance with Counter Support - 2 x daily - 6 x weekly - 1 sets - 3 reps - 30 hold ?Ankle Plantarflexion on Ball with Resistance - 2 x daily - 6 x weekly - 1-2 sets - 10 reps ?Seated Ankle Inversion with Resistance and Legs Crossed - 2 x daily - 6 x weekly - 1-2 sets - 10 reps ?Long Sitting Ankle  Eversion with Resistance - 2 x daily - 6 x weekly - 1-2 sets - 10 reps ?Long Sitting Ankle Inversion with Resistance - 2 x daily - 6 x weekly - 1-2 sets - 10 reps ?  ?  ?  ?ASSESSMENT: ?  ?CLINICAL IMPRESSION: Due to twisting his ankle and having more overall pain we backed down some of his standing activity today and focused more on seated ROM and strength activities. He did show some improvements in ROM since starting PT. We will progress back his standing activity as pain resolves. He does get some relief from Vaso.  ? ?OBJECTIVE IMPAIRMENTS: decreased activity tolerance, difficulty walking, decreased balance, decreased endurance, decreased mobility, decreased ROM, decreased strength, impaired flexibility, impaired LE use, postural dysfunction, and pain. ?  ?ACTIVITY LIMITATIONS: bending, lifting, carry, locomotion, cleaning, community activity, ADLs ?  ?PERSONAL FACTORS: OA, chronic pain, DM with neuropathy are also affecting patient's functional outcome. ?  ?  ?REHAB POTENTIAL: Good ?  ?CLINICAL DECISION MAKING:  evolving/moderate ?EVALUATION COMPLEXITY: moderate ?  ?  ?  ?GOALS: ?Short term PT Goals (target date for Short term goals are 4 weeks 11/22/21) ?Pt will be I and compliant with HEP. ?Baseline:  ?Goal status: Met 11/08/21 ?Pt will decrease pain by 25% overall ?Baseline: ?Goal status: Met 11/08/21 ?  ?Long term PT goals (target dates for all long  term goals are 8 weeks 12/20/21) ?  ?Pt will improve  Lt ankle strength to at 5/5 MMT in supine to improve functional strength ?Baseline: ?Goal status: New ?Pt will improve FOTO to at least 57% functional to show improv

## 2021-11-15 ENCOUNTER — Other Ambulatory Visit: Payer: Self-pay

## 2021-11-15 ENCOUNTER — Encounter: Payer: Self-pay | Admitting: Physical Therapy

## 2021-11-15 ENCOUNTER — Ambulatory Visit (INDEPENDENT_AMBULATORY_CARE_PROVIDER_SITE_OTHER): Payer: 59 | Admitting: Physical Therapy

## 2021-11-15 DIAGNOSIS — M6281 Muscle weakness (generalized): Secondary | ICD-10-CM | POA: Diagnosis not present

## 2021-11-15 DIAGNOSIS — M25572 Pain in left ankle and joints of left foot: Secondary | ICD-10-CM | POA: Diagnosis not present

## 2021-11-15 DIAGNOSIS — M25675 Stiffness of left foot, not elsewhere classified: Secondary | ICD-10-CM | POA: Diagnosis not present

## 2021-11-15 DIAGNOSIS — R2689 Other abnormalities of gait and mobility: Secondary | ICD-10-CM | POA: Diagnosis not present

## 2021-11-15 DIAGNOSIS — R600 Localized edema: Secondary | ICD-10-CM | POA: Diagnosis not present

## 2021-11-15 NOTE — Therapy (Signed)
?OUTPATIENT PHYSICAL THERAPY TREATMENT NOTE ? ? ?Patient Name: Collin [name] ?MRN: [id number] ?DOB:[birth year], 71 y.o., male ?Today's Date: 11/15/2021 ? ?PCP: Sandi Mariscal, MD ?REFERRING PROVIDER: Margret Chance, MD ? ? PT End of Session - 11/15/21 1010   ? ? Visit Number 5   ? Number of Visits 15   ? Date for PT Re-Evaluation 12/20/21   ? PT Start Time 0930   ? PT Stop Time 1015   ? PT Time Calculation (min) 45 min   ? Activity Tolerance Patient tolerated treatment well   ? Behavior During Therapy [facility] for tasks assessed/performed   ? ?  ?  ? ?  ? ? ? ? ?History reviewed. No pertinent past medical history. ?History reviewed. No pertinent surgical history. ?There are no problems to display for this patient. ? ? ? ?THERAPY DIAG:  ?Stiffness of left foot, not elsewhere classified ? ?Pain in left ankle and joints of left foot ? ?Other abnormalities of gait and mobility ? ?Muscle weakness (generalized) ? ?Localized edema ? ?REFERRING DIAG: M21.6X2 (ICD-10-CM) - Other acquired deformities of left foot ?N39.767 (ICD-10-CM) - Primary osteoarthritis, left ankle and foot ?  ?PCP: Sandi Mariscal, MD ?  ?REFERRING PROVIDER: Nelle Don, MD ?  ?ONSET DATE: Left foot fusion triple arthrodesis DOS 04/21/2021 ? ?  ?SUBJECTIVE:  ?  ?SUBJECTIVE STATEMENT: ?He relays he walked a lot this weekend at a home and garden show, had some pain from this but not much swelling and now it feels better ? ?  ?PAIN:  ?Are you having pain? Yes ?NPRS scale: 2/10 upon arrival  ?Pain location: Lt foot ?PAIN TYPE: aching and sharp ?Pain description: constant  ?Aggravating factors: standing or walking more than 10 min, stairs ?Relieving factors: ice, pain meds ?  ?PRECAUTIONS: None ?  ?WEIGHT BEARING RESTRICTIONS No ?  ?PATIENT GOALS : decreased pain and swelling in her left foot ?  ?  ?OBJECTIVE:  ?  ?DIAGNOSTIC FINDINGS: XR from [facility] 08/26/21 Findings/impression:  ?Alignment: No dislocation. Pes planovalgus.  ? ?Hardware: Status post subtalar,  calcaneocuboid and talonavicular  ?arthrodesis. Hardware appears intact and in unchanged position compared to  ?prior. No new or increasing perihardware lucencies.  ? ?Bones: Osseous demineralization. No acutely displaced fracture identified.  ?Plantar calcaneal enthesophyte.  ? ?Joint spaces: Talonavicular, subtalar and calcaneocuboid articulations  ?remain visualized. 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  ?  ?PATIENT SURVEYS:  ?FOTO 10/25/21  38% functional, goal is 57% ?  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  ?LE AROM/PROM: ?  ?A/PROM Right ?10/25/2021 Left ?10/25/2021 Left  ?11/11/21  ?Hip flexion       ?Hip extension       ?Hip abduction       ?Hip adduction       ?Hip internal rotation       ?Hip external rotation       ?Knee flexion       ?Knee extension       ?Ankle dorsiflexion   2/3 4/6  ?Ankle plantarflexion   30/40 40/  ?Ankle inversion   5/10 10/  ?Ankle eversion   5/10 10/  ? (Blank rows = not tested) ?  ?LE HAL:PFXTKW in sitting ?  ?MMT Left ?10/25/2021 Left ?11/15/21  ?Hip flexion     ?Hip extension     ?Hip abduction     ?Hip adduction     ?Hip internal rotation     ?Hip  external rotation     ?Knee flexion     ?Knee extension     ?Ankle dorsiflexion 5 5  ?Ankle plantarflexion 4+ 5 in supine but 4- in standing  ?Ankle inversion 4 5  ?Ankle eversion 4 4+  ? (Blank rows = not tested) ?  ?  ?GAIT: ?11/05/21 ?Distance walked: 200 ?Assistive device utilized: None ?Level of assistance: Complete Independence ?Comments: pes planus and limited DF and ER of left foot however this has improved since last visit ? ?10/25/21 ?Distance walked: 100 ?Assistive device utilized: None ?Level of assistance: Complete Independence ?Comments: pes planus and limited DF with excessive ER of left foot ?  ?TODAY'S TREATMENT: ?11/15/21 ?Therapeutic Exercise: ? Aerobic:recumbent bike L4 X 8 min ?Supine:Ankle 4 way green X 20 ea ?Prone: ? Seated:Leg press 75# 2X15 single leg ea  side, calf raise on leg press 25# 3X10 on left.  ? Standing:Gastroc stretch and soleus stretch 30 sec X 3 bilat on slantboard. Heel raises off 4 inch step 2x10 bilat. Tandem walk 3 round trips at counter top ? ?Modalities: Vasopnuematic Left ankle 10 min, high compression, 34 deg ? ?11/11/21 ?Therapeutic Exercise: ? Aerobic:Nu step L7 X 6 min LE only ?Supine:Ankle 4 way green X 15 ea ?Prone: ? Seated:round rocker board Lt ankle A-P, lateral, and circles CW and CCW X 20 ea ? Standing:Gastroc stretch and soleus stretch 30 sec X 3 bilat on slantboard. Heel and toe raises 2x10 bilat. Tandem balance with left leg behind 30 sec X 3 ? ?Trigger Point Dry Needling:  ?Modalities: Vasopnuematic Left ankle 10 min, high compression, 34 deg ? ?11/08/21 ?Therapeutic Exercise: ? Aerobic: ?Supine:Ankle 4 way green X 20 ea ?Prone: ? Seated:circular rocker board Lt ankle 2 min lateral, 2 min A-P, 2 min circles CW and CCW. ?Standing:Gastroc stretch and soleus stretch 60 sec X 2 bilat on slantboard. Heel raises off foam beam 2x10 bilat. Tandem walk X 3 round trips in bars. walking lateral on foam beam 5 round trips in bars. Lateral step down keeping Lt foot on 6 inch step X15. ? ?Trigger Point Dry Needling:  ?Modalities: Vasopnuematic Left ankle 10 min, high compression, 34 deg ? ?  ?  ?PATIENT EDUCATION:  ?Education details: HEP,POC,KT tape ?Person educated: Patient ?Education method: Explanation, Demonstration, Verbal cues, and Handouts ?Education comprehension: verbalized understanding and needs further education ?  ?  ?HOME EXERCISE PROGRAM: ?Access Code: [email or web address] ?URL: [email or web address] ?Date: 10/25/2021 ?Prepared by: Elsie Ra ?  ?Exercises ?Standing Gastroc Stretch at [employer] - 2 x daily - 6 x weekly - 1 sets - 3 reps - 30 hold ?Standing Soleus Stretch at Counter - 2 x daily - 6 x weekly - 1 sets - 3 reps - 30 hold ?Heel Toe Raises with Counter Support - 2 x daily - 6 x weekly - 2-3 sets - 10 reps ?Standing  Dorsiflexion Self-Mobilization on Step - 2 x daily - 6 x weekly - 1 sets - 10 reps - 5-10 sec hold ?Arch Lifting - 2 x daily - 6 x weekly - 3 sets - 10 reps ?Standing Tandem Balance with Counter Support - 2 x daily - 6 x weekly - 1 sets - 3 reps - 30 hold ?Ankle Plantarflexion on Ball with Resistance - 2 x daily - 6 x weekly - 1-2 sets - 10 reps ?Seated Ankle Inversion with Resistance and Legs Crossed - 2 x daily - 6 x weekly - 1-2 sets - 10 reps ?Long Sitting Ankle Eversion with Resistance -  2 x daily - 6 x weekly - 1-2 sets - 10 reps ?Long Sitting Ankle Inversion with Resistance - 2 x daily - 6 x weekly - 1-2 sets - 10 reps ?  ?  ?  ?ASSESSMENT: ?  ?CLINICAL IMPRESSION: He is progressing well with his strength, ROM, and standing activity tolerance. We progressed this some today in session as he was not having as much pain. Fatigue still noted and he will continue to benefit from PT for this.  ? ?OBJECTIVE IMPAIRMENTS: decreased activity tolerance, difficulty walking, decreased balance, decreased endurance, decreased mobility, decreased ROM, decreased strength, impaired flexibility, impaired LE use, postural dysfunction, and pain. ?  ?ACTIVITY LIMITATIONS: bending, lifting, carry, locomotion, cleaning, community activity, ADLs ?  ?PERSONAL FACTORS: OA, chronic pain, DM with neuropathy are also affecting patient's functional outcome. ?  ?  ?REHAB POTENTIAL: Good ?  ?CLINICAL DECISION MAKING:  evolving/moderate ?EVALUATION COMPLEXITY: moderate ?  ?  ?  ?GOALS: ?Short term PT Goals (target date for Short term goals are 4 weeks 11/22/21) ?Pt will be I and compliant with HEP. ?Baseline:  ?Goal status: Met 11/08/21 ?Pt will decrease pain by 25% overall ?Baseline: ?Goal status: Met 11/08/21 ?  ?Long term PT goals (target dates for all long term goals are 8 weeks 12/20/21) ?  ?Pt will improve  Lt ankle strength to at 5/5 MMT in supine to improve functional strength ?Baseline: ?Goal status: New ?Pt will improve FOTO to at least  57% functional to show improved function ?Baseline: ?Goal status: New ?Pt will reduce pain by overall 50% overall with standing activity ?Baseline:9/10 ?Goal status: New ?Pt will be able to ambulate community dist

## 2021-11-16 ENCOUNTER — Ambulatory Visit: Payer: 59 | Admitting: Podiatry

## 2021-11-18 ENCOUNTER — Ambulatory Visit (INDEPENDENT_AMBULATORY_CARE_PROVIDER_SITE_OTHER): Payer: 59 | Admitting: Physical Therapy

## 2021-11-18 ENCOUNTER — Encounter: Payer: Self-pay | Admitting: Physical Therapy

## 2021-11-18 DIAGNOSIS — M25572 Pain in left ankle and joints of left foot: Secondary | ICD-10-CM | POA: Diagnosis not present

## 2021-11-18 DIAGNOSIS — M6281 Muscle weakness (generalized): Secondary | ICD-10-CM | POA: Diagnosis not present

## 2021-11-18 DIAGNOSIS — R600 Localized edema: Secondary | ICD-10-CM

## 2021-11-18 DIAGNOSIS — R2689 Other abnormalities of gait and mobility: Secondary | ICD-10-CM | POA: Diagnosis not present

## 2021-11-18 DIAGNOSIS — M25675 Stiffness of left foot, not elsewhere classified: Secondary | ICD-10-CM | POA: Diagnosis not present

## 2021-11-18 NOTE — Therapy (Signed)
?OUTPATIENT PHYSICAL THERAPY TREATMENT NOTE ? ? ?Patient Name: Collin [name] ?MRN: [id number] ?DOB:Apr 17, 1951, 71 y.o., male ?Today's Date: 11/18/2021 ? ?PCP: Sandi Mariscal, MD ?REFERRING PROVIDER: Margret Chance, MD ? ? PT End of Session - 11/18/21 9326   ? ? Visit Number 6   ? Number of Visits 15   ? Date for PT Re-Evaluation 12/20/21   ? PT Start Time [phone]   ? PT Stop Time 1020   ? PT Time Calculation (min) 49 min   ? Activity Tolerance Patient tolerated treatment well   ? Behavior During Therapy [facility] for tasks assessed/performed   ? ?  ?  ? ?  ? ? ? ? ?History reviewed. No pertinent past medical history. ?History reviewed. No pertinent surgical history. ?There are no problems to display for this patient. ? ? ? ?THERAPY DIAG:  ?Stiffness of left foot, not elsewhere classified ? ?Pain in left ankle and joints of left foot ? ?Other abnormalities of gait and mobility ? ?Muscle weakness (generalized) ? ?Localized edema ? ?REFERRING DIAG: M21.6X2 (ICD-10-CM) - Other acquired deformities of left foot ?P80.998 (ICD-10-CM) - Primary osteoarthritis, left ankle and foot ?  ?PCP: Sandi Mariscal, MD ?  ?REFERRING PROVIDER: Nelle Don, MD ?  ?ONSET DATE: Left foot fusion triple arthrodesis DOS 04/21/2021 ? ?  ?SUBJECTIVE:  ?  ?SUBJECTIVE STATEMENT: ?He relays he had some pain overall in the lateral aspect of his foot after walking yesterday but this was resolved some with pain relieving cream and tylenol. He says his ankle joint is doing well. He says stairs are becoming easier ? ?  ?PAIN:  ?Are you having pain? Yes ?NPRS scale: 2/10 upon arrival  ?Pain location: Lt foot ?PAIN TYPE: aching  ?Aggravating factors: prolonged walking, stairs ?Relieving factors: ice, pain meds ?  ?PRECAUTIONS: None ?  ?WEIGHT BEARING RESTRICTIONS No ?  ?PATIENT GOALS : decreased pain and swelling in her left foot ?  ?  ?OBJECTIVE:  ?  ?DIAGNOSTIC FINDINGS: XR from [facility] 08/26/21 Findings/impression:  ?Alignment: No dislocation. Pes planovalgus.   ? ?Hardware: Status post subtalar, calcaneocuboid and talonavicular  ?arthrodesis. Hardware appears intact and in unchanged position compared to  ?prior. No new or increasing perihardware lucencies.  ? ?Bones: Osseous demineralization. No acutely displaced fracture identified.  ?Plantar calcaneal enthesophyte.  ? ?Joint spaces: Talonavicular, subtalar and calcaneocuboid articulations  ?remain visualized. 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  ?  ?PATIENT SURVEYS:  ?FOTO 10/25/21  38% functional, goal is 57% ?  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  ?LE AROM/PROM: ?  ?A/PROM Right ?10/25/2021 Left ?10/25/2021 Left  ?11/11/21  ?Hip flexion       ?Hip extension       ?Hip abduction       ?Hip adduction       ?Hip internal rotation       ?Hip external rotation       ?Knee flexion       ?Knee extension       ?Ankle dorsiflexion   2/3 4/6  ?Ankle plantarflexion   30/40 40/  ?Ankle inversion   5/10 10/  ?Ankle eversion   5/10 10/  ? (Blank rows = not tested) ?  ?LE PJA:SNKNLZ in sitting ?  ?MMT Left ?10/25/2021 Left ?11/15/21  ?Hip flexion     ?Hip extension     ?Hip abduction     ?Hip adduction     ?Hip internal rotation     ?  Hip external rotation     ?Knee flexion     ?Knee extension     ?Ankle dorsiflexion 5 5  ?Ankle plantarflexion 4+ 5 in supine but 4- in standing  ?Ankle inversion 4 5  ?Ankle eversion 4 4+  ? (Blank rows = not tested) ?  ?  ?GAIT: ?11/05/21 ?Distance walked: 200 ?Assistive device utilized: None ?Level of assistance: Complete Independence ?Comments: pes planus and limited DF and ER of left foot  ? ?10/25/21 ?Distance walked: 100 ?Assistive device utilized: None ?Level of assistance: Complete Independence ?Comments: pes planus and limited DF with excessive ER of left foot ?  ?TODAY'S TREATMENT: ?11/18/21 ?Therapeutic Exercise: ? Aerobic:recumbent bike L5 X 8 min ?Supine:Ankle 4 way green X 20 ea ?Prone: ? Seated:Leg press 81# 2X15 single leg ea side,  calf raise on leg press 31# 3X10 on left.  ? Standing:Gastroc stretch and soleus stretch 30 sec X 3 bilat on slantboard. Heel raises off 4 inch step 2x10 bilat. Tandem walk 5 round trips in bars. Step ups and down in front (up with Lt and down and Rt) 6 inch X 10 and lateral step ups and overs  X10 bilat 6 inch with UE support in bars. ? ?Modalities: Vasopnuematic Left ankle 10 min, high compression, 34 deg ? ?11/15/21 ?Therapeutic Exercise: ? Aerobic:recumbent bike L4 X 8 min ?Supine:Ankle 4 way green X 20 ea ?Prone: ? Seated:Leg press 75# 2X15 single leg ea side, calf raise on leg press 25# 3X10 on left.  ? Standing:Gastroc stretch and soleus stretch 30 sec X 3 bilat on slantboard. Heel raises off 4 inch step 2x10 bilat. Tandem walk 3 round trips at counter top ? ?Modalities: Vasopnuematic Left ankle 10 min, high compression, 34 deg ? ?11/11/21 ?Therapeutic Exercise: ? Aerobic:Nu step L7 X 6 min LE only ?Supine:Ankle 4 way green X 15 ea ?Prone: ? Seated:round rocker board Lt ankle A-P, lateral, and circles CW and CCW X 20 ea ? Standing:Gastroc stretch and soleus stretch 30 sec X 3 bilat on slantboard. Heel and toe raises 2x10 bilat. Tandem balance with left leg behind 30 sec X 3 ? ?Trigger Point Dry Needling:  ?Modalities: Vasopnuematic Left ankle 10 min, high compression, 34 deg ? ? ?  ?  ?PATIENT EDUCATION:  ?Education details: HEP,POC,KT tape ?Person educated: Patient ?Education method: Explanation, Demonstration, Verbal cues, and Handouts ?Education comprehension: verbalized understanding and needs further education ?  ?  ?HOME EXERCISE PROGRAM: ?Access Code: [email or web address] ?URL: [email or web address] ?Date: 10/25/2021 ?Prepared by: Elsie Ra ?  ?Exercises ?Standing Gastroc Stretch at [employer] - 2 x daily - 6 x weekly - 1 sets - 3 reps - 30 hold ?Standing Soleus Stretch at Counter - 2 x daily - 6 x weekly - 1 sets - 3 reps - 30 hold ?Heel Toe Raises with Counter Support - 2 x daily - 6 x weekly - 2-3  sets - 10 reps ?Standing Dorsiflexion Self-Mobilization on Step - 2 x daily - 6 x weekly - 1 sets - 10 reps - 5-10 sec hold ?Arch Lifting - 2 x daily - 6 x weekly - 3 sets - 10 reps ?Standing Tandem Balance with Counter Support - 2 x daily - 6 x weekly - 1 sets - 3 reps - 30 hold ?Ankle Plantarflexion on Ball with Resistance - 2 x daily - 6 x weekly - 1-2 sets - 10 reps ?Seated Ankle Inversion with Resistance and Legs Crossed - 2 x daily - 6 x weekly - 1-2 sets -  10 reps ?Long Sitting Ankle Eversion with Resistance - 2 x daily - 6 x weekly - 1-2 sets - 10 reps ?Long Sitting Ankle Inversion with Resistance - 2 x daily - 6 x weekly - 1-2 sets - 10 reps ?  ?  ?  ?ASSESSMENT: ?  ?CLINICAL IMPRESSION: He may be expericing some peroneal tendonitis in his outer left ankle based on his subjective symptoms. This is however managed with rest, pain cream, and tylenol and has not limited his PT sessions and we did progress to stair training step up exercises. We will continue to work to improve his Lt ankle function.  ? ?OBJECTIVE IMPAIRMENTS: decreased activity tolerance, difficulty walking, decreased balance, decreased endurance, decreased mobility, decreased ROM, decreased strength, impaired flexibility, impaired LE use, postural dysfunction, and pain. ?  ?ACTIVITY LIMITATIONS: bending, lifting, carry, locomotion, cleaning, community activity, ADLs ?  ?PERSONAL FACTORS: OA, chronic pain, DM with neuropathy are also affecting patient's functional outcome. ?  ?  ?REHAB POTENTIAL: Good ?  ?CLINICAL DECISION MAKING:  evolving/moderate ?EVALUATION COMPLEXITY: moderate ?  ?  ?  ?GOALS: ?Short term PT Goals (target date for Short term goals are 4 weeks 11/22/21) ?Pt will be I and compliant with HEP. ?Baseline:  ?Goal status: Met 11/08/21 ?Pt will decrease pain by 25% overall ?Baseline: ?Goal status: Met 11/08/21 ?  ?Long term PT goals (target dates for all long term goals are 8 weeks 12/20/21) ?  ?Pt will improve  Lt ankle strength to at  5/5 MMT in supine to improve functional strength ?Baseline: ?Goal status: New ?Pt will improve FOTO to at least 57% functional to show improved function ?Baseline: ?Goal status: New ?Pt will reduce pain by

## 2021-11-23 ENCOUNTER — Ambulatory Visit (INDEPENDENT_AMBULATORY_CARE_PROVIDER_SITE_OTHER): Payer: 59 | Admitting: Physical Therapy

## 2021-11-23 ENCOUNTER — Encounter: Payer: Self-pay | Admitting: Physical Therapy

## 2021-11-23 DIAGNOSIS — R2689 Other abnormalities of gait and mobility: Secondary | ICD-10-CM | POA: Diagnosis not present

## 2021-11-23 DIAGNOSIS — M25675 Stiffness of left foot, not elsewhere classified: Secondary | ICD-10-CM | POA: Diagnosis not present

## 2021-11-23 DIAGNOSIS — R600 Localized edema: Secondary | ICD-10-CM

## 2021-11-23 DIAGNOSIS — M6281 Muscle weakness (generalized): Secondary | ICD-10-CM | POA: Diagnosis not present

## 2021-11-23 DIAGNOSIS — M25572 Pain in left ankle and joints of left foot: Secondary | ICD-10-CM

## 2021-11-23 NOTE — Therapy (Signed)
?OUTPATIENT PHYSICAL THERAPY TREATMENT NOTE ? ? ?Patient Name: Collin [name] ?MRN: [id number] ?DOB:02/05/1951, 71 y.o., male ?Today's Date: 11/23/2021 ? ?PCP: Sandi Mariscal, MD ?REFERRING PROVIDER: Margret Chance, MD ? ? PT End of Session - 11/23/21 1221   ? ? Visit Number 7   ? Number of Visits 15   ? Date for PT Re-Evaluation 12/20/21   ? Authorization Type aetna   ? PT Start Time 1146   ? PT Stop Time 1230   ? PT Time Calculation (min) 44 min   ? Activity Tolerance Patient tolerated treatment well   ? Behavior During Therapy [facility] for tasks assessed/performed   ? ?  ?  ? ?  ? ? ? ? ? ?History reviewed. No pertinent past medical history. ?History reviewed. No pertinent surgical history. ?There are no problems to display for this patient. ? ? ? ?THERAPY DIAG:  ?Stiffness of left foot, not elsewhere classified ? ?Pain in left ankle and joints of left foot ? ?Other abnormalities of gait and mobility ? ?Muscle weakness (generalized) ? ?Localized edema ? ?REFERRING DIAG: M21.6X2 (ICD-10-CM) - Other acquired deformities of left foot ?K35.465 (ICD-10-CM) - Primary osteoarthritis, left ankle and foot ?  ?PCP: Sandi Mariscal, MD ?  ?REFERRING PROVIDER: Nelle Don, MD ?  ?ONSET DATE: Left foot fusion triple arthrodesis DOS 04/21/2021 ? ?  ?SUBJECTIVE:  ?  ?SUBJECTIVE STATEMENT: ?He states he good after last session but then had to run errands and had a lot more pain in this ankle over the weekend.  ? ?  ?PAIN:  ?Are you having pain? Yes ?NPRS scale: 3/10 upon arrival  ?Pain location: Lt foot ?PAIN TYPE: aching  ?Aggravating factors: prolonged walking, stairs ?Relieving factors: ice, pain meds ?  ?PRECAUTIONS: None ?  ?WEIGHT BEARING RESTRICTIONS No ?  ?PATIENT GOALS : decreased pain and swelling in her left foot ?  ?  ?OBJECTIVE:  ?  ?DIAGNOSTIC FINDINGS: XR from [facility] 08/26/21 Findings/impression:  ?Alignment: No dislocation. Pes planovalgus.  ? ?Hardware: Status post subtalar, calcaneocuboid and talonavicular  ?arthrodesis.  Hardware appears intact and in unchanged position compared to  ?prior. No new or increasing perihardware lucencies.  ? ?Bones: Osseous demineralization. No acutely displaced fracture identified.  ?Plantar calcaneal enthesophyte.  ? ?Joint spaces: Talonavicular, subtalar and calcaneocuboid articulations  ?remain visualized. 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  ?  ?PATIENT SURVEYS:  ?FOTO 10/25/21  38% functional, goal is 57% ?  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  ?LE AROM/PROM: ?  ?A/PROM Right ?10/25/2021 Left ?10/25/2021 Left  ?11/11/21  ?Hip flexion       ?Hip extension       ?Hip abduction       ?Hip adduction       ?Hip internal rotation       ?Hip external rotation       ?Knee flexion       ?Knee extension       ?Ankle dorsiflexion   2/3 4/6  ?Ankle plantarflexion   30/40 40/  ?Ankle inversion   5/10 10/  ?Ankle eversion   5/10 10/  ? (Blank rows = not tested) ?  ?LE KCL:EXNTZG in sitting ?  ?MMT Left ?10/25/2021 Left ?11/15/21  ?Hip flexion     ?Hip extension     ?Hip abduction     ?Hip adduction     ?Hip internal rotation     ?Hip external rotation     ?  Knee flexion     ?Knee extension     ?Ankle dorsiflexion 5 5  ?Ankle plantarflexion 4+ 5 in supine but 4- in standing  ?Ankle inversion 4 5  ?Ankle eversion 4 4+  ? (Blank rows = not tested) ?  ?  ?GAIT: ?11/05/21 ?Distance walked: 200 ?Assistive device utilized: None ?Level of assistance: Complete Independence ?Comments: pes planus and limited DF and ER of left foot  ? ?10/25/21 ?Distance walked: 100 ?Assistive device utilized: None ?Level of assistance: Complete Independence ?Comments: pes planus and limited DF with excessive ER of left foot ?  ?TODAY'S TREATMENT: ?11/23/21 ?Therapeutic Exercise: ? Aerobic:UBE LE L8 X 8 min ?Supine:Ankle 4 way green X 15 ea ?Prone: ? Seated:Leg press 100# 2X15 DL,  calf raise on leg press 31# 3X10 on left.  ? Standing:Gastroc stretch and soleus stretch 30 sec X 3 bilat on  slantboard. Heel raises off 4 inch step 2x10 bilat. Tandem walk 3 round trips at counter top. Step ups and down in front (up with Lt and down and Rt) 6 inch X 10 and lateral step ups and overs  X10 bilat 6 inch with no UE support ? ?Modalities: Vasopnuematic Left ankle 10 min, high compression, 34 deg ? ?11/18/21 ?Therapeutic Exercise: ? Aerobic:recumbent bike L5 X 8 min ?Supine:Ankle 4 way green X 20 ea ?Prone: ? Seated:Leg press 81# 2X15 single leg ea side, calf raise on leg press 31# 3X10 on left.  ? Standing:Gastroc stretch and soleus stretch 30 sec X 3 bilat on slantboard. Heel raises off 4 inch step 2x10 bilat. Tandem walk 5 round trips in bars. Step ups and down in front (up with Lt and down and Rt) 6 inch X 10 and lateral step ups and overs  X10 bilat 6 inch with UE support in bars. ? ?Modalities: Vasopnuematic Left ankle 10 min, high compression, 34 deg ? ?11/15/21 ?Therapeutic Exercise: ? Aerobic:recumbent bike L4 X 8 min ?Supine:Ankle 4 way green X 20 ea ?Prone: ? Seated:Leg press 75# 2X15 single leg ea side, calf raise on leg press 25# 3X10 on left.  ? Standing:Gastroc stretch and soleus stretch 30 sec X 3 bilat on slantboard. Heel raises off 4 inch step 2x10 bilat. Tandem walk 3 round trips at counter top ? ?Modalities: Vasopnuematic Left ankle 10 min, high compression, 34 deg  ?  ?PATIENT EDUCATION:  ?Education details: HEP,POC,KT tape ?Person educated: Patient ?Education method: Explanation, Demonstration, Verbal cues, and Handouts ?Education comprehension: verbalized understanding and needs further education ?  ?  ?HOME EXERCISE PROGRAM: ?Access Code: [email or web address] ?URL: [email or web address] ?Date: 10/25/2021 ?Prepared by: Elsie Ra ?  ?Exercises ?Standing Gastroc Stretch at [employer] - 2 x daily - 6 x weekly - 1 sets - 3 reps - 30 hold ?Standing Soleus Stretch at Counter - 2 x daily - 6 x weekly - 1 sets - 3 reps - 30 hold ?Heel Toe Raises with Counter Support - 2 x daily - 6 x weekly - 2-3  sets - 10 reps ?Standing Dorsiflexion Self-Mobilization on Step - 2 x daily - 6 x weekly - 1 sets - 10 reps - 5-10 sec hold ?Arch Lifting - 2 x daily - 6 x weekly - 3 sets - 10 reps ?Standing Tandem Balance with Counter Support - 2 x daily - 6 x weekly - 1 sets - 3 reps - 30 hold ?Ankle Plantarflexion on Ball with Resistance - 2 x daily - 6 x weekly - 1-2 sets - 10 reps ?Seated Ankle Inversion  with Resistance and Legs Crossed - 2 x daily - 6 x weekly - 1-2 sets - 10 reps ?Long Sitting Ankle Eversion with Resistance - 2 x daily - 6 x weekly - 1-2 sets - 10 reps ?Long Sitting Ankle Inversion with Resistance - 2 x daily - 6 x weekly - 1-2 sets - 10 reps ?  ?  ?  ?ASSESSMENT: ?  ?CLINICAL IMPRESSION: Some complaints of Lt ankle joint pain today likely from increased activity but he did have good overall tolerance to his session. PT recommending to continue POC ? ?OBJECTIVE IMPAIRMENTS: decreased activity tolerance, difficulty walking, decreased balance, decreased endurance, decreased mobility, decreased ROM, decreased strength, impaired flexibility, impaired LE use, postural dysfunction, and pain. ?  ?ACTIVITY LIMITATIONS: bending, lifting, carry, locomotion, cleaning, community activity, ADLs ?  ?PERSONAL FACTORS: OA, chronic pain, DM with neuropathy are also affecting patient's functional outcome. ?  ?  ?REHAB POTENTIAL: Good ?  ?CLINICAL DECISION MAKING:  evolving/moderate ?EVALUATION COMPLEXITY: moderate ?  ?  ?  ?GOALS: ?Short term PT Goals (target date for Short term goals are 4 weeks 11/22/21) ?Pt will be I and compliant with HEP. ?Baseline:  ?Goal status: Met 11/08/21 ?Pt will decrease pain by 25% overall ?Baseline: ?Goal status: Met 11/08/21 ?  ?Long term PT goals (target dates for all long term goals are 8 weeks 12/20/21) ?  ?Pt will improve  Lt ankle strength to at 5/5 MMT in supine to improve functional strength ?Baseline: ?Goal status: New ?Pt will improve FOTO to at least 57% functional to show improved  function ?Baseline: ?Goal status: New ?Pt will reduce pain by overall 50% overall with standing activity ?Baseline:9/10 ?Goal status: New ?Pt will be able to ambulate community distances at least 600 ft and

## 2021-11-25 DIAGNOSIS — Z981 Arthrodesis status: Secondary | ICD-10-CM | POA: Diagnosis not present

## 2021-11-25 DIAGNOSIS — M19072 Primary osteoarthritis, left ankle and foot: Secondary | ICD-10-CM | POA: Diagnosis not present

## 2021-11-25 DIAGNOSIS — Z9889 Other specified postprocedural states: Secondary | ICD-10-CM | POA: Diagnosis not present

## 2021-11-25 DIAGNOSIS — M7730 Calcaneal spur, unspecified foot: Secondary | ICD-10-CM | POA: Diagnosis not present

## 2021-11-25 DIAGNOSIS — M216X2 Other acquired deformities of left foot: Secondary | ICD-10-CM | POA: Diagnosis not present

## 2021-12-01 ENCOUNTER — Encounter: Payer: Self-pay | Admitting: Physical Therapy

## 2021-12-01 ENCOUNTER — Ambulatory Visit (INDEPENDENT_AMBULATORY_CARE_PROVIDER_SITE_OTHER): Payer: 59 | Admitting: Physical Therapy

## 2021-12-01 DIAGNOSIS — M6281 Muscle weakness (generalized): Secondary | ICD-10-CM

## 2021-12-01 DIAGNOSIS — M25675 Stiffness of left foot, not elsewhere classified: Secondary | ICD-10-CM

## 2021-12-01 DIAGNOSIS — R600 Localized edema: Secondary | ICD-10-CM | POA: Diagnosis not present

## 2021-12-01 DIAGNOSIS — M25572 Pain in left ankle and joints of left foot: Secondary | ICD-10-CM

## 2021-12-01 DIAGNOSIS — R2689 Other abnormalities of gait and mobility: Secondary | ICD-10-CM

## 2021-12-01 NOTE — Therapy (Signed)
?OUTPATIENT PHYSICAL THERAPY TREATMENT NOTE ? ? ?Patient Name: Collin [name] ?MRN: [id number] ?DOB:05/30/1951, 71 y.o., male ?Today's Date: 12/01/2021 ? ?PCP: Salli RealSun, Yun, MD ?REFERRING PROVIDER: Johnsie KindredAmendola, Annunziato, MD ? ? PT End of Session - 12/01/21 1610   ? ? Visit Number 8   ? Number of Visits 15   ? Date for PT Re-Evaluation 12/20/21   ? Authorization Type aetna   ? PT Start Time 1600   ? PT Stop Time 1645   ? PT Time Calculation (min) 45 min   ? Activity Tolerance Patient tolerated treatment well   ? Behavior [facility] for tasks assessed/performed   ? ?  ?  ? ?  ? ? ? ? ? ?History reviewed. No pertinent past medical history. ?History reviewed. No pertinent surgical history. ?There are no problems to display for this patient. ? ? ? ?THERAPY DIAG:  ?Stiffness of left foot, not elsewhere classified ? ?Pain in left ankle and joints of left foot ? ?Other abnormalities of gait and mobility ? ?Muscle weakness (generalized) ? ?Localized edema ? ?REFERRING DIAG: M21.6X2 (ICD-10-CM) - Other acquired deformities of left foot ?[id number] (ICD-10-CM) - Primary osteoarthritis, left ankle and foot ?  ?PCP: Salli RealSun, Yun, MD ?  ?REFERRING PROVIDER: Sharman CheekAmendola, Annunzieto, MD ?  ?ONSET DATE: Left foot fusion triple arthrodesis DOS 04/21/2021 ? ?  ?SUBJECTIVE:  ?  ?SUBJECTIVE STATEMENT: ?He states he returned to surgeon and was found to have a broke screw in his ankle. Patient states the surgeon wants to leave  ? ?  ?PAIN:  ?Are you having pain? Yes ?NPRS scale: 3/10 upon arrival  ?Pain location: Lt foot ?PAIN TYPE: aching  ?Aggravating factors: prolonged walking, stairs ?Relieving factors: ice, pain meds ?  ?PRECAUTIONS: None ?  ?WEIGHT BEARING RESTRICTIONS No ?  ?PATIENT GOALS : decreased pain and swelling in her left foot ?  ?  ?OBJECTIVE:  ?  ?DIAGNOSTIC FINDINGS: XR from [facility] 08[facility]26/21 Findings/impression:  ?Alignment: No dislocation. Pes planovalgus.  ? ?Hardware: Status post subtalar, calcaneocuboid and talonavicular   ?arthrodesis. Hardware appears intact and in unchanged position compared to  ?prior. No new or increasing perihardware lucencies.  ? ?Bones: Osseous demineralization. No acutely displaced fracture identified.  ?Plantar calcaneal enthesophyte.  ? ?Joint spaces: Talonavicular, subtalar and calcaneocuboid articulations  ?remain visualized. 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  ?  ?PATIENT SURVEYS:  ?FOTO 10/25/21  38% functional, goal is 57% ?  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  ?LE AROM/PROM: ?  ?A/PROM Right ?10/25/2021 Left ?10/25/2021 Left  ?11/11/21  ?Hip flexion       ?Hip extension       ?Hip abduction       ?Hip adduction       ?Hip internal rotation       ?Hip external rotation       ?Knee flexion       ?Knee extension       ?Ankle dorsiflexion   2/3 4/6  ?Ankle plantarflexion   30/40 40/  ?Ankle inversion   5/10 10/  ?Ankle eversion   5/10 10/  ? (Blank rows = not tested) ?  ?LE BJY:NWGNFAMT:Tested in sitting ?  ?MMT Left ?10/25/2021 Left ?11/15/21  ?Hip flexion     ?Hip extension     ?Hip abduction     ?Hip adduction     ?Hip internal rotation     ?Hip external rotation     ?  Knee flexion     ?Knee extension     ?Ankle dorsiflexion 5 5  ?Ankle plantarflexion 4+ 5 in supine but 4- in standing  ?Ankle inversion 4 5  ?Ankle eversion 4 4+  ? (Blank rows = not tested) ?  ?  ?GAIT: ?11/05/21 ?Distance walked: 200 ?Assistive device utilized: None ?Level of assistance: Complete Independence ?Comments: pes planus and limited DF and ER of left foot  ? ?10/25/21 ?Distance walked: 100 ?Assistive device utilized: None ?Level of assistance: Complete Independence ?Comments: pes planus and limited DF with excessive ER of left foot ?  ?TODAY'S TREATMENT: ?12/01/21 ?Therapeutic Exercise: ? Aerobic: bike L5 X 8 min ? Seated:calf raise on leg press 31# 3X15 on left.  ? Standing:Gastroc stretch and soleus stretch 30 sec X 3 bilat on slantboard. Tandem walk and toe walking 3 round  trips at counter top. Step ups and down in front (up with Lt and down and Rt) 6 inch X 10 and lateral step ups and overs  X10 bilat 6 inch with no UE support. Black rocker board 2 min A-P, 2 min lateral ? ?Modalities: Vasopnuematic Left ankle 10 min, medium compression, 34 deg ? ?11/23/21 ?Therapeutic Exercise: ? Aerobic:UBE LE L8 X 8 min ?Supine:Ankle 4 way green X 15 ea ?Prone: ? Seated:Leg press 100# 2X15 DL,  calf raise on leg press 31# 3X10 on left.  ? Standing:Gastroc stretch and soleus stretch 30 sec X 3 bilat on slantboard. Heel raises off 4 inch step 2x10 bilat. Tandem walk 3 round trips at counter top. Step ups and down in front (up with Lt and down and Rt) 6 inch X 10 and lateral step ups and overs  X10 bilat 6 inch with no UE support ? ?Modalities: Vasopnuematic Left ankle 10 min, high compression, 34 deg ? ?11/18/21 ?Therapeutic Exercise: ? Aerobic:recumbent bike L5 X 8 min ?Supine:Ankle 4 way green X 20 ea ?Prone: ? Seated:Leg press 81# 2X15 single leg ea side, calf raise on leg press 31# 3X10 on left.  ? Standing:Gastroc stretch and soleus stretch 30 sec X 3 bilat on slantboard. Heel raises off 4 inch step 2x10 bilat. Tandem walk 5 round trips in bars. Step ups and down in front (up with Lt and down and Rt) 6 inch X 10 and lateral step ups and overs  X10 bilat 6 inch with UE support in bars. ? ?Modalities: Vasopnuematic Left ankle 10 min, high compression, 34 deg ? ?11/15/21 ?Therapeutic Exercise: ? Aerobic:recumbent bike L4 X 8 min ?Supine:Ankle 4 way green X 20 ea ?Prone: ? Seated:Leg press 75# 2X15 single leg ea side, calf raise on leg press 25# 3X10 on left.  ? Standing:Gastroc stretch and soleus stretch 30 sec X 3 bilat on slantboard. Heel raises off 4 inch step 2x10 bilat. Tandem walk 3 round trips at counter top ? ?Modalities: Vasopnuematic Left ankle 10 min, high compression, 34 deg  ?  ?PATIENT EDUCATION:  ?Education details: HEP,POC,KT tape ?Person educated: Patient ?Education method:  Explanation, Demonstration, Verbal cues, and Handouts ?Education comprehension: verbalized understanding and needs further education ?  ?  ?HOME EXERCISE PROGRAM: ?Access Code: [email or web address] ?URL: [email or web address] ?Date: 10/25/2021 ?Prepared by: Ivery Quale ?  ?Exercises ?Standing Gastroc Stretch at [employer] - 2 x daily - 6 x weekly - 1 sets - 3 reps - 30 hold ?Standing Soleus Stretch at Counter - 2 x daily - 6 x weekly - 1 sets - 3 reps - 30 hold ?Heel Toe Raises with Counter Support -  2 x daily - 6 x weekly - 2-3 sets - 10 reps ?Standing Dorsiflexion Self-Mobilization on Step - 2 x daily - 6 x weekly - 1 sets - 10 reps - 5-10 sec hold ?Arch Lifting - 2 x daily - 6 x weekly - 3 sets - 10 reps ?Standing Tandem Balance with Counter Support - 2 x daily - 6 x weekly - 1 sets - 3 reps - 30 hold ?Ankle Plantarflexion on Ball with Resistance - 2 x daily - 6 x weekly - 1-2 sets - 10 reps ?Seated Ankle Inversion with Resistance and Legs Crossed - 2 x daily - 6 x weekly - 1-2 sets - 10 reps ?Long Sitting Ankle Eversion with Resistance - 2 x daily - 6 x weekly - 1-2 sets - 10 reps ?Long Sitting Ankle Inversion with Resistance - 2 x daily - 6 x weekly - 1-2 sets - 10 reps ?  ?  ?  ?ASSESSMENT: ?  ?CLINICAL IMPRESSION: He has broken screw found on Xray at latest MD follow up appointment. MD wants to treat this conservatively now and he will call back in he has too much pain and swelling. We worked on Lt ankle ROM and strength as tolerated and used vaso at end for edema control.  ? ?OBJECTIVE IMPAIRMENTS: decreased activity tolerance, difficulty walking, decreased balance, decreased endurance, decreased mobility, decreased ROM, decreased strength, impaired flexibility, impaired LE use, postural dysfunction, and pain. ?  ?ACTIVITY LIMITATIONS: bending, lifting, carry, locomotion, cleaning, community activity, ADLs ?  ?PERSONAL FACTORS: OA, chronic pain, DM with neuropathy are also affecting patient's functional  outcome. ?  ?  ?REHAB POTENTIAL: Good ?  ?CLINICAL DECISION MAKING:  evolving/moderate ?EVALUATION COMPLEXITY: moderate ?  ?  ?  ?GOALS: ?Short term PT Goals (target date for Short term goals are 4 weeks 11/22/21) ?Pt will be I an

## 2021-12-03 ENCOUNTER — Ambulatory Visit (INDEPENDENT_AMBULATORY_CARE_PROVIDER_SITE_OTHER): Payer: 59 | Admitting: Physical Therapy

## 2021-12-03 ENCOUNTER — Encounter: Payer: Self-pay | Admitting: Physical Therapy

## 2021-12-03 DIAGNOSIS — R2689 Other abnormalities of gait and mobility: Secondary | ICD-10-CM

## 2021-12-03 DIAGNOSIS — M25675 Stiffness of left foot, not elsewhere classified: Secondary | ICD-10-CM | POA: Diagnosis not present

## 2021-12-03 DIAGNOSIS — M25572 Pain in left ankle and joints of left foot: Secondary | ICD-10-CM | POA: Diagnosis not present

## 2021-12-03 DIAGNOSIS — M6281 Muscle weakness (generalized): Secondary | ICD-10-CM

## 2021-12-03 DIAGNOSIS — R600 Localized edema: Secondary | ICD-10-CM | POA: Diagnosis not present

## 2021-12-03 NOTE — Therapy (Signed)
?OUTPATIENT PHYSICAL THERAPY TREATMENT NOTE ? ? ?Patient Name: Collin [name] ?MRN: [id number] ?DOB:1950/10/09, 71 y.o., male ?Today's Date: 12/03/2021 ? ?PCP: Sandi Mariscal, MD ?REFERRING PROVIDER: Margret Chance, MD ? ? PT End of Session - 12/03/21 0950   ? ? Visit Number 9   ? Number of Visits 15   ? Date for PT Re-Evaluation 12/20/21   ? Authorization Type aetna   ? PT Start Time 0930   ? PT Stop Time 1015   ? PT Time Calculation (min) 45 min   ? Activity Tolerance Patient tolerated treatment well   ? Behavior During Therapy [facility] for tasks assessed/performed   ? ?  ?  ? ?  ? ? ? ? ? ?History reviewed. No pertinent past medical history. ?History reviewed. No pertinent surgical history. ?There are no problems to display for this patient. ? ? ? ?THERAPY DIAG:  ?Stiffness of left foot, not elsewhere classified ? ?Pain in left ankle and joints of left foot ? ?Other abnormalities of gait and mobility ? ?Muscle weakness (generalized) ? ?Localized edema ? ?REFERRING DIAG: M21.6X2 (ICD-10-CM) - Other acquired deformities of left foot ?U31.497 (ICD-10-CM) - Primary osteoarthritis, left ankle and foot ?  ?PCP: Sandi Mariscal, MD ?  ?REFERRING PROVIDER: Nelle Don, MD ?  ?ONSET DATE: Left foot fusion triple arthrodesis DOS 04/21/2021 ? ?  ?SUBJECTIVE:  ?  ?SUBJECTIVE STATEMENT: ?He relays some mild pain and soreness but not bad ? ?  ?PAIN:  ?Are you having pain? Yes ?NPRS scale: 2/10 upon arrival  ?Pain location: Lt foot ?PAIN TYPE: aching  ?Aggravating factors: prolonged walking, stairs ?Relieving factors: ice, pain meds ?  ?PRECAUTIONS: None ?  ?WEIGHT BEARING RESTRICTIONS No ?  ?PATIENT GOALS : decreased pain and swelling in her left foot ?  ?  ?OBJECTIVE:  ? Diagonstic tests: latest Xray 11/25/21 "Hardware: Status post triple arthrodesis. Interval fracture of the medial  ?talonavicular arthrodesis screw, new compared to 08/26/2021. " ?  ?  ?PATIENT SURVEYS:  ?FOTO 10/25/21  38% functional, goal is 57% ?  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  ?LE AROM/PROM: ?  ?A/PROM Right ?10/25/2021 Left ?10/25/2021 Left  ?11/11/21  ?Hip flexion       ?Hip extension       ?Hip abduction       ?Hip adduction       ?Hip internal rotation       ?Hip external rotation       ?Knee flexion       ?Knee extension       ?Ankle dorsiflexion   2/3 4/6  ?Ankle plantarflexion   30/40 40/  ?Ankle inversion   5/10 10/  ?Ankle eversion   5/10 10/  ? (Blank rows = not tested) ?  ?LE WYO:VZCHYI in sitting ?  ?MMT Left ?10/25/2021 Left ?11/15/21  ?Hip flexion     ?Hip extension     ?Hip abduction     ?Hip adduction     ?Hip internal rotation     ?Hip external rotation     ?Knee flexion     ?Knee extension     ?Ankle dorsiflexion 5 5  ?Ankle plantarflexion 4+ 5 in supine but 4- in standing  ?Ankle inversion 4 5  ?Ankle eversion 4 4+  ? (Blank rows = not tested) ?  ?  ?GAIT: ?11/05/21 ?Distance walked: 200 ?Assistive device utilized: None ?Level of assistance: Complete Independence ?Comments: pes planus and limited  DF and ER of left foot  ? ?10/25/21 ?Distance walked: 100 ?Assistive device utilized: None ?Level of assistance: Complete Independence ?Comments: pes planus and limited DF with excessive ER of left foot ?  ?TODAY'S TREATMENT: ?12/03/21 ?Therapeutic Exercise: ?Aerobic: bike L5 X 8 min ?Seated:calf raise on leg press 31# 3X15 on left.  ?Standing:Gastroc stretch and soleus stretch 30 sec X 3 bilat on slantboard. Tandem walk and toe walking 4 round trips at counter top. Step ups and down in front (up with Lt and down and Rt) 6 inch X 15 and lateral step ups and overs  X10 bilat 6 inch with no UE support. Black rocker board 2 min A-P, 2 min lateral ? ?Modalities: Vasopnuematic Left ankle 10 min, medium compression, 34 deg ? ?12/01/21 ?Therapeutic Exercise: ? Aerobic: bike L5 X 8 min ? Seated:calf raise on leg press 31# 3X15 on left.  ? Standing:Gastroc stretch and soleus stretch 30 sec X 3 bilat on slantboard. Tandem walk and toe walking  4 round trips at counter top. Step ups and down in front (up with Lt and down and Rt) 6 inch X 15 and lateral step ups and overs  X10 bilat 6 inch with no UE support. Black rocker board 2 min A-P, 2 min lateral ? ?Modalities: Vasopnuematic Left ankle 10 min, medium compression, 34 deg ? ?  ?PATIENT EDUCATION:  ?Education details: HEP,POC,KT tape ?Person educated: Patient ?Education method: Explanation, Demonstration, Verbal cues, and Handouts ?Education comprehension: verbalized understanding and needs further education ?  ?  ?HOME EXERCISE PROGRAM: ?Access Code: [email or web address] ?URL: [email or web address] ?Date: 10/25/2021 ?Prepared by: Elsie Ra ?  ?Exercises ?Standing Gastroc Stretch at [employer] - 2 x daily - 6 x weekly - 1 sets - 3 reps - 30 hold ?Standing Soleus Stretch at Counter - 2 x daily - 6 x weekly - 1 sets - 3 reps - 30 hold ?Heel Toe Raises with Counter Support - 2 x daily - 6 x weekly - 2-3 sets - 10 reps ?Standing Dorsiflexion Self-Mobilization on Step - 2 x daily - 6 x weekly - 1 sets - 10 reps - 5-10 sec hold ?Arch Lifting - 2 x daily - 6 x weekly - 3 sets - 10 reps ?Standing Tandem Balance with Counter Support - 2 x daily - 6 x weekly - 1 sets - 3 reps - 30 hold ?Ankle Plantarflexion on Ball with Resistance - 2 x daily - 6 x weekly - 1-2 sets - 10 reps ?Seated Ankle Inversion with Resistance and Legs Crossed - 2 x daily - 6 x weekly - 1-2 sets - 10 reps ?Long Sitting Ankle Eversion with Resistance - 2 x daily - 6 x weekly - 1-2 sets - 10 reps ?Long Sitting Ankle Inversion with Resistance - 2 x daily - 6 x weekly - 1-2 sets - 10 reps ?  ?  ?  ?ASSESSMENT: ?  ?CLINICAL IMPRESSION: He had some swelling after activity today but is doing pretty well with pain levels so we will progress as tolerated based on his pain and swelling.  ? ?OBJECTIVE IMPAIRMENTS: decreased activity tolerance, difficulty walking, decreased balance, decreased endurance, decreased mobility, decreased ROM, decreased  strength, impaired flexibility, impaired LE use, postural dysfunction, and pain. ?  ?ACTIVITY LIMITATIONS: bending, lifting, carry, locomotion, cleaning, community activity, ADLs ?  ?PERSONAL FACTORS: OA, chronic pain, DM with neuropathy are also affecting patient's functional outcome. ?  ?  ?REHAB POTENTIAL: Good ?  ?CLINICAL DECISION MAKING:  evolving/moderate ?EVALUATION COMPLEXITY: moderate ?  ?  ?  ?  GOALS: ?Short term PT Goals (target date for Short term goals are 4 weeks 11/22/21) ?Pt will be I and compliant with HEP. ?Baseline:  ?Goal status: Met 11/08/21 ?Pt will decrease pain by 25% overall ?Baseline: ?Goal status: Met 11/08/21 ?  ?Long term PT goals (target dates for all long term goals are 8 weeks 12/20/21) ?  ?Pt will improve  Lt ankle strength to at 5/5 MMT in supine to improve functional strength ?Baseline: ?Goal status: New ?Pt will improve FOTO to at least 57% functional to show improved function ?Baseline: ?Goal status: New ?Pt will reduce pain by overall 50% overall with standing activity ?Baseline:9/10 ?Goal status: New ?Pt will be able to ambulate community distances at least 600 ft and ambulate up/down stairs with no more than mild complaints ?Baseline: ?Goal status: New ?  ?PLAN: ?PT FREQUENCY: 1-2 times per week  ?  ?PT DURATION: 6-8 weeks ?  ?PLANNED INTERVENTIONS (unless contraindicated): aquatic PT, , cryotherapy, Electrical stimulation, Iontophoresis with 4 mg/ml dexamethasome, Moist heat, traction, Ultrasound, gait training, Therapeutic exercise, balance training, neuromuscular re-education, patient/family education, prosthetic training, manual techniques, passive ROM, dry needling, taping, vasopnuematic device, vestibular, spinal manipulations, joint manipulations ?  ?PLAN FOR NEXT SESSION: Needs Lt ankle mobility and gentle progression for standing/walking tolerance. Vaso ifor edema if desired. ? ? ?Debbe Odea, PT,DPT ?12/03/2021, 9:54 AM ? ?  ? ?

## 2021-12-06 ENCOUNTER — Ambulatory Visit (INDEPENDENT_AMBULATORY_CARE_PROVIDER_SITE_OTHER): Payer: 59 | Admitting: Physical Therapy

## 2021-12-06 ENCOUNTER — Encounter: Payer: Self-pay | Admitting: Physical Therapy

## 2021-12-06 DIAGNOSIS — R2689 Other abnormalities of gait and mobility: Secondary | ICD-10-CM | POA: Diagnosis not present

## 2021-12-06 DIAGNOSIS — M25572 Pain in left ankle and joints of left foot: Secondary | ICD-10-CM | POA: Diagnosis not present

## 2021-12-06 DIAGNOSIS — M25675 Stiffness of left foot, not elsewhere classified: Secondary | ICD-10-CM | POA: Diagnosis not present

## 2021-12-06 DIAGNOSIS — M6281 Muscle weakness (generalized): Secondary | ICD-10-CM | POA: Diagnosis not present

## 2021-12-06 DIAGNOSIS — R600 Localized edema: Secondary | ICD-10-CM

## 2021-12-06 NOTE — Therapy (Signed)
?OUTPATIENT PHYSICAL THERAPY TREATMENT NOTE ?Progress Note reporting period date 10/25/21 to 12/06/21 ? ?See below for objective and subjective measurements relating to patients progress with PT. ? ? ? ?Patient Name: Collin [name] ?MRN: [id number] ?DOB:05-09-1951, 71 y.o., male ?Today's Date: 12/06/2021 ? ?PCP: Sandi Mariscal, MD ?REFERRING PROVIDER: Margret Chance, MD ? ? PT End of Session - 12/06/21 1042   ? ? Visit Number 10   ? Number of Visits 15   ? Date for PT Re-Evaluation 12/20/21   ? Authorization Type aetna   ? PT Start Time 1019   ? PT Stop Time 1104   ? PT Time Calculation (min) 45 min   ? Activity Tolerance Patient tolerated treatment well   ? Behavior During Therapy [facility] for tasks assessed/performed   ? ?  ?  ? ?  ? ? ? ? ? ?History reviewed. No pertinent past medical history. ?History reviewed. No pertinent surgical history. ?There are no problems to display for this patient. ? ? ? ?THERAPY DIAG:  ?Stiffness of left foot, not elsewhere classified ? ?Pain in left ankle and joints of left foot ? ?Other abnormalities of gait and mobility ? ?Muscle weakness (generalized) ? ?Localized edema ? ?REFERRING DIAG: M21.6X2 (ICD-10-CM) - Other acquired deformities of left foot ?J85.631 (ICD-10-CM) - Primary osteoarthritis, left ankle and foot ?  ?PCP: Sandi Mariscal, MD ?  ?REFERRING PROVIDER: Nelle Don, MD ?  ?ONSET DATE: Left foot fusion triple arthrodesis DOS 04/21/2021 ? ?  ?SUBJECTIVE:  ?  ?SUBJECTIVE STATEMENT: ?He relays the pain is only minimal today upon arrival ? ?  ?PAIN:  ?Are you having pain? Yes ?NPRS scale: 1/10 upon arrival  ?Pain location: Lt foot ?PAIN TYPE: aching  ?Aggravating factors: prolonged walking, stairs ?Relieving factors: ice, pain meds ?  ?PRECAUTIONS: None ?  ?WEIGHT BEARING RESTRICTIONS No ?  ?PATIENT GOALS : decreased pain and swelling in her left foot ?  ?  ?OBJECTIVE:  ? Diagonstic tests: latest Xray 11/25/21 "Hardware: Status post triple arthrodesis. Interval fracture of the  medial  ?talonavicular arthrodesis screw, new compared to 08/26/2021. " ?  ?  ?PATIENT SURVEYS:  ?FOTO 10/25/21  38% functional, goal is 57% ?  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  ?LE AROM/PROM: ?  ?A/PROM Left ?10/25/2021 Left  ?11/11/21 Left ?12/06/21  ?Hip flexion      ?Hip extension      ?Hip abduction      ?Hip adduction      ?Hip internal rotation      ?Hip external rotation      ?Knee flexion      ?Knee extension      ?Ankle dorsiflexion 2/3 4/6 7/  ?Ankle plantarflexion 30/40 40/ 38/  ?Ankle inversion 5/10 10/ 15/  ?Ankle eversion 5/10 10/ 11/  ? (Blank rows = not tested) ?  ?LE SHF:WYOVZC in sitting ?  ?MMT Left ?10/25/2021 Left ?11/15/21 Left ?12/06/21  ?Hip flexion      ?Hip extension      ?Hip abduction      ?Hip adduction      ?Hip internal rotation      ?Hip external rotation      ?Knee flexion      ?Knee extension      ?Ankle dorsiflexion '5 5 5  ' ?Ankle plantarflexion 4+ 5 in supine but 4- in standing 5 in supine but 4 in standing  ?Ankle inversion '4 5 5  ' ?Ankle eversion 4 4+  5  ? (Blank rows = not tested) ?  ?  ?GAIT: ?11/05/21 ?Distance walked: 200 ?Assistive device utilized: None ?Level of assistance: Complete Independence ?Comments: pes planus and limited DF and ER of left foot  ? ?10/25/21 ?Distance walked: 100 ?Assistive device utilized: None ?Level of assistance: Complete Independence ?Comments: pes planus and limited DF with excessive ER of left foot ?  ?TODAY'S TREATMENT: ?12/06/21 ?Therapeutic Exercise: ?Aerobic: Nu step L6 X 8 min  ?Standing:Gastroc stretch and soleus stretch 30 sec X 3 bilat on slantboard. Tandem walk and toe walking 4 round trips at counter top. Step ups and down in front (up with Lt and down and Rt) 6 inch X 15 and lateral step ups and overs  X10 bilat 6 inch with no UE support. Black rocker board 2 min A-P, 2 min lateral. Eccentric heel raises Left X10, then bilat heel and toe raises X 20 ? ? ?Modalities: Vasopnuematic Left ankle 10 min, medium  compression, 34 deg ? ?12/03/21 ?Therapeutic Exercise: ?Aerobic: bike L5 X 8 min ?Seated:calf raise on leg press 31# 3X15 on left.  ?Standing:Gastroc stretch and soleus stretch 30 sec X 3 bilat on slantboard. Tandem walk and toe walking 4 round trips at counter top. Step ups and down in front (up with Lt and down and Rt) 6 inch X 15 and lateral step ups and overs  X10 bilat 6 inch with no UE support. Black rocker board 2 min A-P, 2 min lateral ? ?Modalities: Vasopnuematic Left ankle 10 min, medium compression, 34 deg ? ?  ?PATIENT EDUCATION:  ?Education details: HEP,POC,KT tape ?Person educated: Patient ?Education method: Explanation, Demonstration, Verbal cues, and Handouts ?Education comprehension: verbalized understanding and needs further education ?  ?  ?HOME EXERCISE PROGRAM: ?Access Code: [email or web address] ?URL: [email or web address] ?Date: 10/25/2021 ?Prepared by: Elsie Ra ?  ?Exercises ?Standing Gastroc Stretch at [employer] - 2 x daily - 6 x weekly - 1 sets - 3 reps - 30 hold ?Standing Soleus Stretch at Counter - 2 x daily - 6 x weekly - 1 sets - 3 reps - 30 hold ?Heel Toe Raises with Counter Support - 2 x daily - 6 x weekly - 2-3 sets - 10 reps ?Standing Dorsiflexion Self-Mobilization on Step - 2 x daily - 6 x weekly - 1 sets - 10 reps - 5-10 sec hold ?Arch Lifting - 2 x daily - 6 x weekly - 3 sets - 10 reps ?Standing Tandem Balance with Counter Support - 2 x daily - 6 x weekly - 1 sets - 3 reps - 30 hold ?Ankle Plantarflexion on Ball with Resistance - 2 x daily - 6 x weekly - 1-2 sets - 10 reps ?Seated Ankle Inversion with Resistance and Legs Crossed - 2 x daily - 6 x weekly - 1-2 sets - 10 reps ?Long Sitting Ankle Eversion with Resistance - 2 x daily - 6 x weekly - 1-2 sets - 10 reps ?Long Sitting Ankle Inversion with Resistance - 2 x daily - 6 x weekly - 1-2 sets - 10 reps ?  ?  ?  ?ASSESSMENT: ?  ?CLINICAL IMPRESSION: Progress note today shows he is making good overall progress in left ankle  pain, ROM, and strength. He does still lack significant PF strength in standing and still has some stiffeness in left ankle so PT recommending to continue with skilled PT to address these deficits ? ?OBJECTIVE IMPAIRMENTS: decreased activity tolerance, difficulty walking, decreased balance, decreased endurance, decreased mobility, decreased ROM, decreased strength, impaired flexibility,  impaired LE use, postural dysfunction, and pain. ?  ?ACTIVITY LIMITATIONS: bending, lifting, carry, locomotion, cleaning, community activity, ADLs ?  ?PERSONAL FACTORS: OA, chronic pain, DM with neuropathy are also affecting patient's functional outcome. ?  ?  ?REHAB POTENTIAL: Good ?  ?CLINICAL DECISION MAKING:  evolving/moderate ?EVALUATION COMPLEXITY: moderate ?  ?  ?  ?GOALS: ?Short term PT Goals (target date for Short term goals are 4 weeks 11/22/21) ?Pt will be I and compliant with HEP. ?Baseline:  ?Goal status: Met 11/08/21 ?Pt will decrease pain by 25% overall ?Baseline: ?Goal status: Met 11/08/21 ?  ?Long term PT goals (target dates for all long term goals are 8 weeks 12/20/21) ?  ?Pt will improve  Lt ankle strength to at 5/5 MMT in supine to improve functional strength ?Baseline: ?Goal status: ongoing ?Pt will improve FOTO to at least 57% functional to show improved function ?Baseline: ?Goal status:ongoing ?Pt will reduce pain by overall 50% overall with standing activity ?Baseline:9/10 ?Goal status:MET ?Pt will be able to ambulate community distances at least 600 ft and ambulate up/down stairs with no more than mild complaints ?Baseline: ?Goal status: ongoing ?  ?PLAN: ?PT FREQUENCY: 1-2 times per week  ?  ?PT DURATION: 6-8 weeks ?  ?PLANNED INTERVENTIONS (unless contraindicated): aquatic PT, , cryotherapy, Electrical stimulation, Iontophoresis with 4 mg/ml dexamethasome, Moist heat, traction, Ultrasound, gait training, Therapeutic exercise, balance training, neuromuscular re-education, patient/family education, prosthetic  training, manual techniques, passive ROM, dry needling, taping, vasopnuematic device, vestibular, spinal manipulations, joint manipulations ?  ?PLAN FOR NEXT SESSION: Needs Lt ankle mobility and gentle progression

## 2021-12-10 ENCOUNTER — Ambulatory Visit (INDEPENDENT_AMBULATORY_CARE_PROVIDER_SITE_OTHER): Payer: 59 | Admitting: Physical Therapy

## 2021-12-10 ENCOUNTER — Encounter: Payer: Self-pay | Admitting: Physical Therapy

## 2021-12-10 DIAGNOSIS — M25572 Pain in left ankle and joints of left foot: Secondary | ICD-10-CM

## 2021-12-10 DIAGNOSIS — M25675 Stiffness of left foot, not elsewhere classified: Secondary | ICD-10-CM

## 2021-12-10 DIAGNOSIS — R2689 Other abnormalities of gait and mobility: Secondary | ICD-10-CM

## 2021-12-10 DIAGNOSIS — R600 Localized edema: Secondary | ICD-10-CM

## 2021-12-10 DIAGNOSIS — M6281 Muscle weakness (generalized): Secondary | ICD-10-CM

## 2021-12-10 NOTE — Therapy (Signed)
?OUTPATIENT PHYSICAL THERAPY TREATMENT NOTE ? ? ? ?Patient Name: Collin [name] ?MRN: [id number] ?DOB:[birth year], 71 y.o., male ?Today's Date: 12/10/2021 ? ?PCP: Sandi Mariscal, MD ?REFERRING PROVIDER: Margret Chance, MD ? ? PT End of Session - 12/10/21 1034   ? ? Visit Number 11   ? Number of Visits 15   ? Date for PT Re-Evaluation 12/20/21   ? Authorization Type aetna   ? Progress Note Due on Visit 20   ? PT Start Time 1020   ? PT Stop Time 1100   ? PT Time Calculation (min) 40 min   ? Activity Tolerance Patient tolerated treatment well   ? Behavior During Therapy [facility] for tasks assessed/performed   ? ?  ?  ? ?  ? ? ? ? ? ?History reviewed. No pertinent past medical history. ?History reviewed. No pertinent surgical history. ?There are no problems to display for this patient. ? ? ? ?THERAPY DIAG:  ?Stiffness of left foot, not elsewhere classified ? ?Pain in left ankle and joints of left foot ? ?Other abnormalities of gait and mobility ? ?Muscle weakness (generalized) ? ?Localized edema ? ?REFERRING DIAG: M21.6X2 (ICD-10-CM) - Other acquired deformities of left foot ?H29.924 (ICD-10-CM) - Primary osteoarthritis, left ankle and foot ?  ?PCP: Sandi Mariscal, MD ?  ?REFERRING PROVIDER: Nelle Don, MD ?  ?ONSET DATE: Left foot fusion triple arthrodesis DOS 04/21/2021 ? ?  ?SUBJECTIVE:  ?  ?SUBJECTIVE STATEMENT: ?He relays the pain is worse today from errands he was doing and he felt a pop in his ankle X 2 that was painful and he had swelling after this ? ?  ?PAIN:  ?Are you having pain? Yes ?NPRS scale: 4/10 upon arrival  ?Pain location: Lt foot ?PAIN TYPE: aching  ?Aggravating factors: prolonged walking, stairs ?Relieving factors: ice, pain meds ?  ?PRECAUTIONS: None ?  ?WEIGHT BEARING RESTRICTIONS No ?  ?PATIENT GOALS : decreased pain and swelling in her left foot ?  ?  ?OBJECTIVE:  ? Diagonstic tests: latest Xray 11/25/21 "Hardware: Status post triple arthrodesis. Interval fracture of the medial  ?talonavicular  arthrodesis screw, new compared to 08/26/2021. " ?  ?  ?PATIENT SURVEYS:  ?FOTO 10/25/21  38% functional, goal is 57% ?  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  ?LE AROM/PROM: ?  ?A/PROM Left ?10/25/2021 Left  ?11/11/21 Left ?12/06/21  ?Hip flexion      ?Hip extension      ?Hip abduction      ?Hip adduction      ?Hip internal rotation      ?Hip external rotation      ?Knee flexion      ?Knee extension      ?Ankle dorsiflexion 2/3 4/6 7/  ?Ankle plantarflexion 30/40 40/ 38/  ?Ankle inversion 5/10 10/ 15/  ?Ankle eversion 5/10 10/ 11/  ? (Blank rows = not tested) ?  ?LE QAS:TMHDQQ in sitting ?  ?MMT Left ?10/25/2021 Left ?11/15/21 Left ?12/06/21  ?Hip flexion      ?Hip extension      ?Hip abduction      ?Hip adduction      ?Hip internal rotation      ?Hip external rotation      ?Knee flexion      ?Knee extension      ?Ankle dorsiflexion '5 5 5  ' ?Ankle plantarflexion 4+ 5 in supine but 4- in standing 5 in supine but 4 in standing  ?Ankle inversion  '4 5 5  ' ?Ankle eversion 4 4+ 5  ? (Blank rows = not tested) ?  ?  ?GAIT: ?11/05/21 ?Distance walked: 200 ?Assistive device utilized: None ?Level of assistance: Complete Independence ?Comments: pes planus and limited DF and ER of left foot  ? ?10/25/21 ?Distance walked: 100 ?Assistive device utilized: None ?Level of assistance: Complete Independence ?Comments: pes planus and limited DF with excessive ER of left foot ?  ?TODAY'S TREATMENT: ?12/10/21 ?Aerobic: Bike L5 X 8 min  ?Standing:Gastroc stretch and soleus stretch 30 sec X 3 bilat on slantboard.  ?Tandem walk and toe walking 4 round trips at counter top.  ?Step ups and down in front (up with Lt and down and Rt) 6 inch X 15 and lateral step ups and overs  X10 bilat 6 inch with no UE support.  ?Black rocker board 2 min A-P, 2 min lateral.  ?Eccentric heel raises Left X10, then bilat heel and toe raises X 20 ? ?Modalities: Vasopnuematic Left ankle 10 min, medium compression, 34  deg ? ?12/06/21 ?Therapeutic Exercise: ?Aerobic: Nu step L6 X 8 min  ?Standing:Gastroc stretch and soleus stretch 30 sec X 3 bilat on slantboard. Tandem walk and toe walking 4 round trips at counter top. Step ups and down in front (up with Lt and down and Rt) 6 inch X 15 and lateral step ups and overs  X10 bilat 6 inch with no UE support. Black rocker board 2 min A-P, 2 min lateral. Eccentric heel raises Left X10, then bilat heel and toe raises X 20 ? ? ?Modalities: Vasopnuematic Left ankle 10 min, medium compression, 34 deg ? ? ?  ?PATIENT EDUCATION:  ?Education details: HEP,POC,KT tape ?Person educated: Patient ?Education method: Explanation, Demonstration, Verbal cues, and Handouts ?Education comprehension: verbalized understanding and needs further education ?  ?  ?HOME EXERCISE PROGRAM: ?Access Code: [email or web address] ?URL: [email or web address] ?Date: 10/25/2021 ?Prepared by: Elsie Ra ?  ?Exercises ?Standing Gastroc Stretch at [employer] - 2 x daily - 6 x weekly - 1 sets - 3 reps - 30 hold ?Standing Soleus Stretch at Counter - 2 x daily - 6 x weekly - 1 sets - 3 reps - 30 hold ?Heel Toe Raises with Counter Support - 2 x daily - 6 x weekly - 2-3 sets - 10 reps ?Standing Dorsiflexion Self-Mobilization on Step - 2 x daily - 6 x weekly - 1 sets - 10 reps - 5-10 sec hold ?Arch Lifting - 2 x daily - 6 x weekly - 3 sets - 10 reps ?Standing Tandem Balance with Counter Support - 2 x daily - 6 x weekly - 1 sets - 3 reps - 30 hold ?Ankle Plantarflexion on Ball with Resistance - 2 x daily - 6 x weekly - 1-2 sets - 10 reps ?Seated Ankle Inversion with Resistance and Legs Crossed - 2 x daily - 6 x weekly - 1-2 sets - 10 reps ?Long Sitting Ankle Eversion with Resistance - 2 x daily - 6 x weekly - 1-2 sets - 10 reps ?Long Sitting Ankle Inversion with Resistance - 2 x daily - 6 x weekly - 1-2 sets - 10 reps ?  ?  ?  ?ASSESSMENT: ?  ?CLINICAL IMPRESSION: He had overall more pain today but this did not limit his session and  he was still able to perform his usual strength program. We will continue to work to progress his functional level as able. ? ?OBJECTIVE IMPAIRMENTS: decreased activity tolerance, difficulty walking, decreased balance, decreased endurance, decreased mobility, decreased  ROM, decreased strength, impaired flexibility, impaired LE use, postural dysfunction, and pain. ?  ?ACTIVITY LIMITATIONS: bending, lifting, carry, locomotion, cleaning, community activity, ADLs ?  ?PERSONAL FACTORS: OA, chronic pain, DM with neuropathy are also affecting patient's functional outcome. ?  ?  ?REHAB POTENTIAL: Good ?  ?CLINICAL DECISION MAKING:  evolving/moderate ?EVALUATION COMPLEXITY: moderate ?  ?  ?  ?GOALS: ?Short term PT Goals (target date for Short term goals are 4 weeks 11/22/21) ?Pt will be I and compliant with HEP. ?Baseline:  ?Goal status: Met 11/08/21 ?Pt will decrease pain by 25% overall ?Baseline: ?Goal status: Met 11/08/21 ?  ?Long term PT goals (target dates for all long term goals are 8 weeks 12/20/21) ?  ?Pt will improve  Lt ankle strength to at 5/5 MMT in supine to improve functional strength ?Baseline: ?Goal status: ongoing ?Pt will improve FOTO to at least 57% functional to show improved function ?Baseline: ?Goal status:ongoing ?Pt will reduce pain by overall 50% overall with standing activity ?Baseline:9/10 ?Goal status:MET ?Pt will be able to ambulate community distances at least 600 ft and ambulate up/down stairs with no more than mild complaints ?Baseline: ?Goal status: ongoing ?  ?PLAN: ?PT FREQUENCY: 1-2 times per week  ?  ?PT DURATION: 6-8 weeks ?  ?PLANNED INTERVENTIONS (unless contraindicated): aquatic PT, , cryotherapy, Electrical stimulation, Iontophoresis with 4 mg/ml dexamethasome, Moist heat, traction, Ultrasound, gait training, Therapeutic exercise, balance training, neuromuscular re-education, patient/family education, prosthetic training, manual techniques, passive ROM, dry needling, taping, vasopnuematic  device, vestibular, spinal manipulations, joint manipulations ?  ?PLAN FOR NEXT SESSION: Needs Lt ankle mobility and gentle progression for standing/walking tolerance. Vaso ifor edema if desired. ? ? ?Debbe Odea, PT,DPT ?4/21/202

## 2021-12-13 ENCOUNTER — Ambulatory Visit (INDEPENDENT_AMBULATORY_CARE_PROVIDER_SITE_OTHER): Payer: 59 | Admitting: Physical Therapy

## 2021-12-13 ENCOUNTER — Encounter: Payer: Self-pay | Admitting: Physical Therapy

## 2021-12-13 ENCOUNTER — Ambulatory Visit (INDEPENDENT_AMBULATORY_CARE_PROVIDER_SITE_OTHER): Payer: 59 | Admitting: Podiatry

## 2021-12-13 DIAGNOSIS — M2142 Flat foot [pes planus] (acquired), left foot: Secondary | ICD-10-CM | POA: Diagnosis not present

## 2021-12-13 DIAGNOSIS — M6281 Muscle weakness (generalized): Secondary | ICD-10-CM | POA: Diagnosis not present

## 2021-12-13 DIAGNOSIS — R600 Localized edema: Secondary | ICD-10-CM

## 2021-12-13 DIAGNOSIS — M25675 Stiffness of left foot, not elsewhere classified: Secondary | ICD-10-CM | POA: Diagnosis not present

## 2021-12-13 DIAGNOSIS — Z794 Long term (current) use of insulin: Secondary | ICD-10-CM | POA: Diagnosis not present

## 2021-12-13 DIAGNOSIS — E1142 Type 2 diabetes mellitus with diabetic polyneuropathy: Secondary | ICD-10-CM

## 2021-12-13 DIAGNOSIS — M960 Pseudarthrosis after fusion or arthrodesis: Secondary | ICD-10-CM

## 2021-12-13 DIAGNOSIS — M21079 Valgus deformity, not elsewhere classified, unspecified ankle: Secondary | ICD-10-CM

## 2021-12-13 DIAGNOSIS — R2689 Other abnormalities of gait and mobility: Secondary | ICD-10-CM

## 2021-12-13 DIAGNOSIS — M25572 Pain in left ankle and joints of left foot: Secondary | ICD-10-CM | POA: Diagnosis not present

## 2021-12-13 DIAGNOSIS — M2141 Flat foot [pes planus] (acquired), right foot: Secondary | ICD-10-CM

## 2021-12-13 NOTE — Therapy (Signed)
?OUTPATIENT PHYSICAL THERAPY TREATMENT NOTE ? ? ? ?Patient Name: Collin [name] ?MRN: [id number] ?DOB:07-03-1951, 71 y.o., male ?Today's Date: 12/13/2021 ? ?PCP: Sandi Mariscal, MD ?REFERRING PROVIDER: Margret Chance, MD ? ? PT End of Session - 12/13/21 0951   ? ? Visit Number 12   ? Number of Visits 15   ? Date for PT Re-Evaluation 12/20/21   ? Authorization Type aetna   ? Progress Note Due on Visit 20   ? PT Start Time [phone]   ? PT Stop Time 1017   ? PT Time Calculation (min) 38 min   ? Activity Tolerance Patient tolerated treatment well   ? Behavior During Therapy [facility] for tasks assessed/performed   ? ?  ?  ? ?  ? ? ? ? ? ?History reviewed. No pertinent past medical history. ?History reviewed. No pertinent surgical history. ?There are no problems to display for this patient. ? ? ? ?THERAPY DIAG:  ?Stiffness of left foot, not elsewhere classified ? ?Pain in left ankle and joints of left foot ? ?Other abnormalities of gait and mobility ? ?Muscle weakness (generalized) ? ?Localized edema ? ?REFERRING DIAG: M21.6X2 (ICD-10-CM) - Other acquired deformities of left foot ?W23.762 (ICD-10-CM) - Primary osteoarthritis, left ankle and foot ?  ?PCP: Sandi Mariscal, MD ?  ?REFERRING PROVIDER: Nelle Don, MD ?  ?ONSET DATE: Left foot fusion triple arthrodesis DOS 04/21/2021 ? ?  ?SUBJECTIVE:  ?  ?SUBJECTIVE STATEMENT: ?He relays the pain is worse today from errands he was doing and he felt a pop in his ankle X 2 that was painful and he had swelling after this ? ?  ?PAIN:  ?Are you having pain? Yes ?NPRS scale: 4/10 upon arrival  ?Pain location: Lt foot ?PAIN TYPE: aching  ?Aggravating factors: prolonged walking, stairs ?Relieving factors: ice, pain meds ?  ?PRECAUTIONS: None ?  ?WEIGHT BEARING RESTRICTIONS No ?  ?PATIENT GOALS : decreased pain and swelling in her left foot ?  ?  ?OBJECTIVE:  ? Diagonstic tests: latest Xray 11/25/21 "Hardware: Status post triple arthrodesis. Interval fracture of the medial  ?talonavicular  arthrodesis screw, new compared to 08/26/2021. " ?  ?  ?PATIENT SURVEYS:  ?FOTO 10/25/21  38% functional, goal is 57% ?  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  ?LE AROM/PROM: ?  ?A/PROM Left ?10/25/2021 Left  ?11/11/21 Left ?12/06/21  ?Hip flexion      ?Hip extension      ?Hip abduction      ?Hip adduction      ?Hip internal rotation      ?Hip external rotation      ?Knee flexion      ?Knee extension      ?Ankle dorsiflexion 2/3 4/6 7/  ?Ankle plantarflexion 30/40 40/ 38/  ?Ankle inversion 5/10 10/ 15/  ?Ankle eversion 5/10 10/ 11/  ? (Blank rows = not tested) ?  ?LE GBT:DVVOHY in sitting ?  ?MMT Left ?10/25/2021 Left ?11/15/21 Left ?12/06/21  ?Hip flexion      ?Hip extension      ?Hip abduction      ?Hip adduction      ?Hip internal rotation      ?Hip external rotation      ?Knee flexion      ?Knee extension      ?Ankle dorsiflexion '5 5 5  ' ?Ankle plantarflexion 4+ 5 in supine but 4- in standing 5 in supine but 4 in standing  ?Ankle inversion  '4 5 5  ' ?Ankle eversion 4 4+ 5  ? (Blank rows = not tested) ?  ?  ?GAIT: ?11/05/21 ?Distance walked: 200 ?Assistive device utilized: None ?Level of assistance: Complete Independence ?Comments: pes planus and limited DF and ER of left foot  ? ?10/25/21 ?Distance walked: 100 ?Assistive device utilized: None ?Level of assistance: Complete Independence ?Comments: pes planus and limited DF with excessive ER of left foot ?  ?TODAY'S TREATMENT: ?12/13/21 ?Aerobic: Nu step L6 X 6 min  ?Standing:Gastroc stretch and soleus stretch 30 sec X 3 bilat on slantboard.  ?Tandem walk and toe walking 3 round trips at counter top.  ?Step ups and down in front (up with Lt and down and Rt) 6 inch X 10 and lateral step ups and overs X10 bilat 6 inch with no UE support.  ?Black rocker board 2 min A-P, 2 min lateral.  ?Eccentric heel raises Left X10, then bilat heel and toe raises X 20 ? ?Modalities: Vasopnuematic Left ankle 10 min, medium compression, 34 deg ? ?12/10/21 ?Aerobic:  Bike L5 X 8 min  ?Standing:Gastroc stretch and soleus stretch 30 sec X 3 bilat on slantboard.  ?Tandem walk and toe walking 4 round trips at counter top.  ?Step ups and down in front (up with Lt and down and Rt) 6 inch X 15 and lateral step ups and overs  X10 bilat 6 inch with no UE support.  ?Black rocker board 2 min A-P, 2 min lateral.  ?Eccentric heel raises Left X10, then bilat heel and toe raises X 20 ? ?Modalities: Vasopnuematic Left ankle 10 min, medium compression, 34 deg ? ? ?  ?PATIENT EDUCATION:  ?Education details: HEP,POC ?Person educated: Patient ?Education method: Explanation, Demonstration, Verbal cues, and Handouts ?Education comprehension: verbalized understanding and needs further education ?  ?  ?HOME EXERCISE PROGRAM: ?Access Code: [email or web address] ?URL: [email or web address] ?Date: 10/25/2021 ?Prepared by: Elsie Ra ?  ?Exercises ?Standing Gastroc Stretch at [employer] - 2 x daily - 6 x weekly - 1 sets - 3 reps - 30 hold ?Standing Soleus Stretch at Counter - 2 x daily - 6 x weekly - 1 sets - 3 reps - 30 hold ?Heel Toe Raises with Counter Support - 2 x daily - 6 x weekly - 2-3 sets - 10 reps ?Standing Dorsiflexion Self-Mobilization on Step - 2 x daily - 6 x weekly - 1 sets - 10 reps - 5-10 sec hold ?Arch Lifting - 2 x daily - 6 x weekly - 3 sets - 10 reps ?Standing Tandem Balance with Counter Support - 2 x daily - 6 x weekly - 1 sets - 3 reps - 30 hold ?Ankle Plantarflexion on Ball with Resistance - 2 x daily - 6 x weekly - 1-2 sets - 10 reps ?Seated Ankle Inversion with Resistance and Legs Crossed - 2 x daily - 6 x weekly - 1-2 sets - 10 reps ?Long Sitting Ankle Eversion with Resistance - 2 x daily - 6 x weekly - 1-2 sets - 10 reps ?Long Sitting Ankle Inversion with Resistance - 2 x daily - 6 x weekly - 1-2 sets - 10 reps ?  ?  ?  ?ASSESSMENT: ?  ?CLINICAL IMPRESSION: His progress has slowed due to pain and swelling in his ankle, he does have a known broke screw seen on latest X-ray so I  recommended he follow back up with MD about this due to his continued pain and swelling limiting his progress. ? ?OBJECTIVE IMPAIRMENTS: decreased activity tolerance, difficulty walking,  decreased balance, decreased endurance, decreased mobility, decreased ROM, decreased strength, impaired flexibility, impaired LE use, postural dysfunction, and pain. ?  ?ACTIVITY LIMITATIONS: bending, lifting, carry, locomotion, cleaning, community activity, ADLs ?  ?PERSONAL FACTORS: OA, chronic pain, DM with neuropathy are also affecting patient's functional outcome. ?  ?  ?REHAB POTENTIAL: Good ?  ?CLINICAL DECISION MAKING:  evolving/moderate ?EVALUATION COMPLEXITY: moderate ?  ?  ?  ?GOALS: ?Short term PT Goals (target date for Short term goals are 4 weeks 11/22/21) ?Pt will be I and compliant with HEP. ?Baseline:  ?Goal status: Met 11/08/21 ?Pt will decrease pain by 25% overall ?Baseline: ?Goal status: Met 11/08/21 ?  ?Long term PT goals (target dates for all long term goals are 8 weeks 12/20/21) ?  ?Pt will improve  Lt ankle strength to at 5/5 MMT in supine to improve functional strength ?Baseline: ?Goal status: ongoing ?Pt will improve FOTO to at least 57% functional to show improved function ?Baseline: ?Goal status:ongoing ?Pt will reduce pain by overall 50% overall with standing activity ?Baseline:9/10 ?Goal status:MET ?Pt will be able to ambulate community distances at least 600 ft and ambulate up/down stairs with no more than mild complaints ?Baseline: ?Goal status: ongoing ?  ?PLAN: ?PT FREQUENCY: 1-2 times per week  ?  ?PT DURATION: 6-8 weeks ?  ?PLANNED INTERVENTIONS (unless contraindicated): aquatic PT, , cryotherapy, Electrical stimulation, Iontophoresis with 4 mg/ml dexamethasome, Moist heat, traction, Ultrasound, gait training, Therapeutic exercise, balance training, neuromuscular re-education, patient/family education, prosthetic training, manual techniques, passive ROM, dry needling, taping, vasopnuematic device,  vestibular, spinal manipulations, joint manipulations ?  ?PLAN FOR NEXT SESSION: progress as tolerated. Vaso ifor edema if desired. ? ? ?Debbe Odea, PT,DPT ?12/13/2021, 9:54 AM ? ?  ? ?

## 2021-12-13 NOTE — Patient Instructions (Signed)
Discuss with Dr Trinna Balloon office about being seen sooner. You may want to task him about: ? ?Getting a CT scan to see how much bone has healed  ?Using a "bone stimulator" ?Checking Vitamin D and other labs that may impact bone healing  ?

## 2021-12-14 ENCOUNTER — Ambulatory Visit (INDEPENDENT_AMBULATORY_CARE_PROVIDER_SITE_OTHER): Payer: 59

## 2021-12-14 DIAGNOSIS — M2042 Other hammer toe(s) (acquired), left foot: Secondary | ICD-10-CM | POA: Diagnosis not present

## 2021-12-14 DIAGNOSIS — M2041 Other hammer toe(s) (acquired), right foot: Secondary | ICD-10-CM

## 2021-12-14 DIAGNOSIS — L84 Corns and callosities: Secondary | ICD-10-CM

## 2021-12-14 DIAGNOSIS — E1142 Type 2 diabetes mellitus with diabetic polyneuropathy: Secondary | ICD-10-CM

## 2021-12-14 NOTE — Progress Notes (Signed)
SITUATION ?Reason for Consult: Evaluation for Bilateral Custom Foot Orthoses ?Patient / Caregiver Report: Patient would like to qualify for diabetic shoes but is unable to get medicare at this time so is ready for replacement diabetic style foot orthotics. ? ?OBJECTIVE DATA: ?Patient History / Diagnosis:  ?  ICD-10-CM   ?1. Hammer toes of both feet  M20.41   ? M20.42   ?  ?2. Diabetic polyneuropathy associated with type 2 diabetes mellitus (HCC)  E11.42   ?  ?3. Corns  L84   ?  ? ? ?Current or Previous Devices:   Current user ? ?Foot Examination: ?Skin presentation:   Intact ?Ulcers & Callousing:   Corns ?Toe / Foot Deformities:  Hammertoes ?Weight Bearing Presentation:  Planus ?Sensation:    Compromised ? ?Shoe Size:    14XW ? ?ORTHOTIC RECOMMENDATION ?Recommended Device: 1x pair of custom functional foot orthotics ? ?GOALS OF ORTHOSES ?- Reduce Pain ?- Prevent Foot Deformity ?- Prevent Progression of Further Foot Deformity ?- Relieve Pressure ?- Improve the Overall Biomechanical Function of the Foot and Lower Extremity. ? ?ACTIONS PERFORMED ?Potential out of pocket cost was communicated to patient. Patient understood and consent to casting. Patient was casted for Foot Orthoses via crush box. Procedure was explained and patient tolerated procedure well. Casts were shipped to central fabrication. All questions were answered and concerns addressed. ? ?PLAN ?Patient is to be called for fitting when devices are ready.  ? ? ?

## 2021-12-15 ENCOUNTER — Ambulatory Visit (INDEPENDENT_AMBULATORY_CARE_PROVIDER_SITE_OTHER): Payer: 59 | Admitting: Physical Therapy

## 2021-12-15 ENCOUNTER — Encounter: Payer: Self-pay | Admitting: Physical Therapy

## 2021-12-15 DIAGNOSIS — I25118 Atherosclerotic heart disease of native coronary artery with other forms of angina pectoris: Secondary | ICD-10-CM | POA: Insufficient documentation

## 2021-12-15 DIAGNOSIS — M25572 Pain in left ankle and joints of left foot: Secondary | ICD-10-CM

## 2021-12-15 DIAGNOSIS — R600 Localized edema: Secondary | ICD-10-CM | POA: Diagnosis not present

## 2021-12-15 DIAGNOSIS — M6281 Muscle weakness (generalized): Secondary | ICD-10-CM | POA: Diagnosis not present

## 2021-12-15 DIAGNOSIS — R2689 Other abnormalities of gait and mobility: Secondary | ICD-10-CM

## 2021-12-15 DIAGNOSIS — M25675 Stiffness of left foot, not elsewhere classified: Secondary | ICD-10-CM | POA: Diagnosis not present

## 2021-12-15 DIAGNOSIS — E1142 Type 2 diabetes mellitus with diabetic polyneuropathy: Secondary | ICD-10-CM | POA: Insufficient documentation

## 2021-12-15 NOTE — Therapy (Signed)
?OUTPATIENT PHYSICAL THERAPY TREATMENT NOTE/Discharge ?PHYSICAL THERAPY DISCHARGE SUMMARY ? ?Visits from Start of Care: 13 ? ?Current functional level related to goals / functional outcomes: ?See below ?  ?Remaining deficits: ?See below ?  ?Education / Equipment: ?HEP and recommendations to follow up with MD  ?Plan: ?Patient agrees to discharge.  Patient goals were not met. Patient is being discharged due to plateau in progress likely due to broken screw and will be referred back to MD.  ? ? ? ? ? ? ?Patient Name: Collin [name] ?MRN: [id number] ?DOB:09-Apr-1951, 71 y.o., male ?Today's Date: 12/15/2021 ? ?PCP: Sandi Mariscal, MD ?REFERRING PROVIDER: Margret Chance, MD ? ? PT End of Session - 12/15/21 5859   ? ? Visit Number 13   ? Number of Visits 15   ? Date for PT Re-Evaluation 12/20/21   ? Authorization Type aetna   ? Progress Note Due on Visit 20   ? PT Start Time 0930   ? PT Stop Time 1015   ? PT Time Calculation (min) 45 min   ? Activity Tolerance Patient tolerated treatment well   ? Behavior During Therapy [facility] for tasks assessed/performed   ? ?  ?  ? ?  ? ? ? ? ? ?History reviewed. No pertinent past medical history. ?History reviewed. No pertinent surgical history. ?Patient Active Problem List  ? Diagnosis Date Noted  ? 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 Polyneuropathy due to type 2 diabetes mellitus ([place]) 12/15/2021  ? Planovalgus deformity of foot, acquired, left 03/30/2021  ? Hyperglycemia due to type 2 diabetes mellitus ([place]) 02/10/2021  ? Hyperlipidemia 05/23/2018  ? Essential hypertension 05/21/2018  ? Diabetes mellitus ([place]) 05/21/2018  ? ? ? ?THERAPY DIAG:  ?Stiffness of left foot, not elsewhere classified ? ?Pain in left ankle and joints of left foot ? ?Other abnormalities of gait and mobility ? ?Muscle weakness (generalized) ? ?Localized edema ? ?REFERRING DIAG: M21.6X2 (ICD-10-CM) - Other acquired deformities of left  foot ?Y92.446 (ICD-10-CM) - Primary osteoarthritis, left ankle and foot ?  ?PCP: Sandi Mariscal, MD ?  ?REFERRING PROVIDER: Nelle Don, MD ?  ?ONSET DATE: Left foot fusion triple arthrodesis DOS 04/21/2021 ? ?  ?SUBJECTIVE:  ?  ?SUBJECTIVE STATEMENT: ?He relays the pain is not improving, he feels he is no longer making any progress and agrees to discharge today to be referred back to MD ? ?  ?PAIN:  ?Are you having pain? Yes ?NPRS scale: 4/10 upon arrival  ?Pain location: Lt foot ?PAIN TYPE: aching  ?Aggravating factors: prolonged walking, stairs ?Relieving factors: ice, pain meds ?  ?PRECAUTIONS: None ?  ?WEIGHT BEARING RESTRICTIONS No ?  ?PATIENT GOALS : decreased pain and swelling in her left foot ?  ?  ?OBJECTIVE:  ? Diagonstic tests: latest Xray 11/25/21 "Hardware: Status post triple arthrodesis. Interval fracture of the medial  ?talonavicular arthrodesis screw, new compared to 08/26/2021. " ?  ?  ?PATIENT SURVEYS:  ?FOTO 10/25/21  38% functional, goal is 57% ?  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  ?LE AROM/PROM: ?  ?A/PROM Left ?10/25/2021 Left  ?11/11/21 Left ?12/06/21 Left ?12/15/21  ?Hip flexion       ?Hip extension       ?Hip abduction       ?Hip adduction       ?Hip internal rotation       ?Hip external rotation       ?  Knee flexion       ?Knee extension       ?Ankle dorsiflexion 2/3 4/6 7/ 7/  ?Ankle plantarflexion 30/40 40/ 38/ 38/  ?Ankle inversion 5/10 10/ 15/ 15/  ?Ankle eversion 5/10 10/ 11/ 11/  ? (Blank rows = not tested) ?  ?LE WRU:EAVWUJ in sitting ?  ?MMT Left ?10/25/2021 Left ?11/15/21 Left ?12/06/21  ?Hip flexion      ?Hip extension      ?Hip abduction      ?Hip adduction      ?Hip internal rotation      ?Hip external rotation      ?Knee flexion      ?Knee extension      ?Ankle dorsiflexion '5 5 5  ' ?Ankle plantarflexion 4+ 5 in supine but 4- in standing 5 in supine but 4 in standing  ?Ankle inversion '4 5 5  ' ?Ankle eversion 4 4+ 5  ? (Blank rows = not tested) ?  ?   ?GAIT: ?12/15/21 ?Antalgic gait on Left LE due to pain in left ankle  ? ?11/05/21 ?Distance walked: 200 ?Assistive device utilized: None ?Level of assistance: Complete Independence ?Comments: pes planus and limited DF and ER of left foot  ? ?10/25/21 ?Distance walked: 100 ?Assistive device utilized: None ?Level of assistance: Complete Independence ?Comments: pes planus and limited DF with excessive ER of left foot ?  ?TODAY'S TREATMENT: ?12/15/21 ?Aerobic: Nu step L6 X 10 min  ?Standing:Gastroc stretch and soleus stretch 30 sec X 3 bilat on slantboard.  ?Tandem walk and toe walking 3 round trips at counter top.  ?Black rocker board 2 min A-P, 2 min lateral.  ?Eccentric heel raises Left X10, then bilat heel and toe raises X 20 ? ?Modalities: Vasopnuematic Left ankle 10 min, medium compression, 34 deg ? ?  ?PATIENT EDUCATION:  ?Education details: HEP,POC ?Person educated: Patient ?Education method: Explanation, Demonstration, Verbal cues, and Handouts ?Education comprehension: verbalized understanding and needs further education ?  ?  ?HOME EXERCISE PROGRAM: ?Access Code: [email or web address] ?URL: [email or web address] ?Date: 12/15/2021 ?Prepared by: Elsie Ra ? ?Exercises ?- Standing Gastroc Stretch at [employer]  - 2 x daily - 6 x weekly - 1 sets - 3 reps - 30 hold ?- Standing Soleus Stretch at Counter  - 2 x daily - 6 x weekly - 1 sets - 3 reps - 30 hold ?- Standing Dorsiflexion Self-Mobilization on Step  - 2 x daily - 6 x weekly - 1 sets - 10 reps - 5-10 sec hold ?- Tandem Walking with Counter Support  - 2 x daily - 6 x weekly - 1 sets - 5 reps ?- Heel Toe Raises with Counter Support  - 2 x daily - 6 x weekly - 2 sets - 10 reps ?- Toe Walking  - 2 x daily - 6 x weekly - 1 sets - 3 reps ? ?  ?  ?  ?ASSESSMENT: ?  ?CLINICAL IMPRESSION: We will discharge him today due to plateau in progress likely due to broken screw in his left ankle. I recommended he follow back up with MD about this due to his continued pain and  swelling limiting his progress and he agrees and has made a follow up appointment. ? ?OBJECTIVE IMPAIRMENTS: decreased activity tolerance, difficulty walking, decreased balance, decreased endurance, decreased mobility, decreased ROM, decreased strength, impaired flexibility, impaired LE use, postural dysfunction, and pain. ?  ?ACTIVITY LIMITATIONS: bending, lifting, carry, locomotion, cleaning, community activity, ADLs ?  ?PERSONAL FACTORS: OA, chronic pain, DM  with neuropathy are also affecting patient's functional outcome. ?  ?  ?REHAB POTENTIAL: Good ?  ?CLINICAL DECISION MAKING:  evolving/moderate ?EVALUATION COMPLEXITY: moderate ?  ?  ?  ?GOALS: ?Short term PT Goals (target date for Short term goals are 4 weeks 11/22/21) ?Pt will be I and compliant with HEP. ?Baseline:  ?Goal status: Met 11/08/21 ?Pt will decrease pain by 25% overall ?Baseline: ?Goal status: Met 11/08/21 ?  ?Long term PT goals (target dates for all long term goals are 8 weeks 12/20/21) ?  ?Pt will improve  Lt ankle strength to at 5/5 MMT in supine to improve functional strength ?Baseline: ?Goal status: not met ?Pt will improve FOTO to at least 57% functional to show improved function ?Baseline: ?Goal status:not met but did improve to 48% ?Pt will reduce pain by overall 50% overall with standing activity ?Baseline:9/10 ?Goal status not met ?Pt will be able to ambulate community distances at least 600 ft and ambulate up/down stairs with no more than mild complaints ?Baseline: ?Goal status: not met ?  ?PLAN: ?PT FREQUENCY: 1-2 times per week  ?  ?PT DURATION: 6-8 weeks ?  ?PLANNED INTERVENTIONS (unless contraindicated): aquatic PT, , cryotherapy, Electrical stimulation, Iontophoresis with 4 mg/ml dexamethasome, Moist heat, traction, Ultrasound, gait training, Therapeutic exercise, balance training, neuromuscular re-education, patient/family education, prosthetic training, manual techniques, passive ROM, dry needling, taping, vasopnuematic device,  vestibular, spinal manipulations, joint manipulations ?  ?PLAN FOR NEXT SESSION: DC today to HEP and refer back to MD ? ? ?Debbe Odea, PT,DPT ?12/15/2021, 9:40 AM ? ?  ? ?

## 2021-12-15 NOTE — Progress Notes (Signed)
?Subjective:  ?Patient ID: Collin [name], male    DOB: 11-Nov-1950,  MRN: [id number] ? ? ? ?71 y.o. male returns for follow-up.  He underwent triple arthrodesis last fall at [facility].  He is interested in having new shoes and inserts for his feet.  He is still having quite a bit of swelling and pain in his left foot.  He says at his last follow-up visit there was a broken screw but his surgeon said that they should probably just leave it there and let the bone heal on its own.  He has been doing physical therapy and feels like he has plateaued here ? ?Objective:  ?Physical Exam: ?warm, good capillary refill, no trophic changes or ulcerative lesions, normal DP and PT pulses and reduced protective sensation of the toes ? ?On the left foot is incisions are well-healed there is significant edema about the hindfoot and warmth, there is tenderness over the talonavicular joint ? ? ?Radiographs: ?X-ray of the left foot and ankle: Severe pes planovalgus with valgus deformity of the ankle ? ?Study Result ? ?Narrative & Impression  ?CLINICAL DATA:  Left ankle pain for years. ?  ?EXAM: ?MRI OF THE LEFT ANKLE WITHOUT CONTRAST ?  ?TECHNIQUE: ?Multiplanar, multisequence MR imaging of the ankle was performed. No ?intravenous contrast was administered. ?  ?COMPARISON:  None. ?  ?FINDINGS: ?TENDONS ?  ?Peroneal: Peroneal longus tendon intact. Mild tendinosis of the ?peroneus brevis with a longitudinal split tear of the level of the ?lateral malleolus and mild tenosynovitis. ?  ?Posteromedial: Mild tendinosis of the posterior tibial tendon with ?mild tenosynovitis. Flexor hallucis longus tendon intact. Flexor ?digitorum longus tendon intact. ?  ?Anterior: Tibialis anterior tendon intact. Extensor hallucis longus ?tendon intact Extensor digitorum longus tendon intact. ?  ?Achilles:  Intact. ?  ?Plantar Fascia: Intact. Plantar calcaneal spur. ?  ?LIGAMENTS ?  ?Lateral: Thickening of the anterior talofibular ligament  likely ?reflecting prior injury. Calcaneofibular ligament intact. Posterior ?talofibular ligament intact. Anterior and posterior tibiofibular ?ligaments intact. ?  ?Medial: Deltoid ligament intact. Spring ligament intact. ?  ?CARTILAGE ?  ?Ankle Joint: Moderate ankle joint effusion. 7 mm loose body in the ?anterolateral ankle joint space. Partial-thickness cartilage loss ?throughout the tibiotalar joint. ?  ?Subtalar Joints/Sinus Tarsi: Normal subtalar joints. No subtalar ?joint effusion. Effacement of the normal sinus tarsi fat with ?subcortical reactive marrow changes in the talus and calcaneus. ?  ?Bones: Hindfoot valgus. Reactive marrow changes in the distal tip of ?the fibula at the articulation with the calcaneus as can be seen ?with sub fibular impingement. Moderate osteoarthritis of the second ?tarsometatarsal joint. Mild osteoarthritis of the first TMT joint. ?Mild osteoarthritis of the talonavicular joint. Severe ?osteoarthritis of the fourth tarsometatarsal joint. ?  ?Soft Tissue: No fluid collection or hematoma. Mild muscle atrophy of ?the flexor hallucis muscle. Tarsal tunnel is normal. ?  ?IMPRESSION: ?1. Mild tendinosis of the peroneus brevis with a longitudinal split ?tear of the level of the lateral malleolus and mild tenosynovitis. ?2. Mild tendinosis of the posterior tibial tendon with mild ?tenosynovitis. ?3. Hindfoot valgus. Reactive marrow changes in the distal tip of the ?fibula at the articulation with the calcaneus as can be seen with ?sub fibular impingement. ?4. Mild osteoarthritis of the first TMT joint. ?5. 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 ?  ?  ?Electronically Signed ?  By: Elige Ko ?  On: 12/12/2020 13:24  ? ? ?Assessment:  ? ?1. Acquired valgus deformity of ankle   ?2. Pes planus  of both feet   ?3. 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 ? ? ?Plan:  ?Patient  was evaluated and treated and all questions answered. ? ?His foot position is much improved since reconstruction, he does not have quite a bit of pain and swelling feels like he has plateaued with PT.  With the report of the broken screw, my concern would be that there is a possibility of delayed or nonunion.  So far he says he has not used a bone stimulator or had a CT scan.  I recommend he discuss this with his surgeon.  Also recommend he discuss with him his vitamin D level if it should be checked. ? ?Regarding his continued foot deformity I do think he would still benefit from custom molded orthoses, a longitudinal medial arch support that is accommodative in nature.  This should continue to support his foot and reduce chance of pressure callus or ulceration formation. ? ?Return if symptoms worsen or fail to improve.  ? ?

## 2021-12-22 DIAGNOSIS — I10 Essential (primary) hypertension: Secondary | ICD-10-CM | POA: Diagnosis not present

## 2021-12-22 DIAGNOSIS — K3184 Gastroparesis: Secondary | ICD-10-CM | POA: Diagnosis not present

## 2021-12-22 DIAGNOSIS — E1165 Type 2 diabetes mellitus with hyperglycemia: Secondary | ICD-10-CM | POA: Diagnosis not present

## 2021-12-22 DIAGNOSIS — E1142 Type 2 diabetes mellitus with diabetic polyneuropathy: Secondary | ICD-10-CM | POA: Diagnosis not present

## 2021-12-30 ENCOUNTER — Ambulatory Visit: Payer: 59

## 2021-12-30 DIAGNOSIS — E1142 Type 2 diabetes mellitus with diabetic polyneuropathy: Secondary | ICD-10-CM

## 2021-12-30 DIAGNOSIS — M2041 Other hammer toe(s) (acquired), right foot: Secondary | ICD-10-CM

## 2021-12-30 NOTE — Progress Notes (Signed)
SITUATION: ?Reason for Visit: Fitting and Delivery of Custom Fabricated Foot Orthoses ?Patient Report: Patient reports comfort and is satisfied with device. ? ?OBJECTIVE DATA: ?Patient History / Diagnosis:   ?  ICD-10-CM   ?1. Hammer toes of both feet  M20.41   ? M20.42   ?  ?2. Diabetic polyneuropathy associated with type 2 diabetes mellitus (HCC)  E11.42   ?  ? ? ?Provided Device:  Custom Functional Foot Orthotics ?    [employer]: [id number] ? ?GOAL OF ORTHOSIS ?- Improve gait ?- Decrease energy expenditure ?- Improve Balance ?- Provide Triplanar stability of foot complex ?- Facilitate motion ? ?ACTIONS PERFORMED ?Patient was fit with foot orthotics trimmed to shoe last. Patient tolerated fittign procedure.  ? ?Patient was provided with verbal and written instruction and demonstration regarding donning, doffing, wear, care, proper fit, function, purpose, cleaning, and use of the orthosis and in all related precautions and risks and benefits regarding the orthosis. ? ?Patient was also provided with verbal instruction regarding how to report any failures or malfunctions of the orthosis and necessary follow up care. Patient was also instructed to contact our office regarding any change in status that may affect the function of the orthosis. ? ?Patient demonstrated independence with proper donning, doffing, and fit and verbalized understanding of all instructions. ? ?PLAN: ?Patient is to follow up in one week or as necessary (PRN). All questions were answered and concerns addressed. Plan of care was discussed with and agreed upon by the patient. ? ?

## 2022-01-20 DIAGNOSIS — M25572 Pain in left ankle and joints of left foot: Secondary | ICD-10-CM | POA: Diagnosis not present

## 2022-01-20 DIAGNOSIS — Z9889 Other specified postprocedural states: Secondary | ICD-10-CM | POA: Diagnosis not present

## 2022-01-20 DIAGNOSIS — M19072 Primary osteoarthritis, left ankle and foot: Secondary | ICD-10-CM | POA: Diagnosis not present

## 2022-01-20 DIAGNOSIS — M216X2 Other acquired deformities of left foot: Secondary | ICD-10-CM | POA: Diagnosis not present

## 2022-01-25 DIAGNOSIS — M19072 Primary osteoarthritis, left ankle and foot: Secondary | ICD-10-CM | POA: Diagnosis not present

## 2022-01-25 DIAGNOSIS — Z981 Arthrodesis status: Secondary | ICD-10-CM | POA: Diagnosis not present

## 2022-01-25 DIAGNOSIS — T84213D Breakdown (mechanical) of internal fixation device of bones of foot and toes, subsequent encounter: Secondary | ICD-10-CM | POA: Diagnosis not present

## 2022-01-25 DIAGNOSIS — M216X2 Other acquired deformities of left foot: Secondary | ICD-10-CM | POA: Diagnosis not present

## 2022-02-03 DIAGNOSIS — T84213A Breakdown (mechanical) of internal fixation device of bones of foot and toes, initial encounter: Secondary | ICD-10-CM | POA: Diagnosis not present

## 2022-02-03 DIAGNOSIS — Z9889 Other specified postprocedural states: Secondary | ICD-10-CM | POA: Diagnosis not present

## 2022-02-03 DIAGNOSIS — M25572 Pain in left ankle and joints of left foot: Secondary | ICD-10-CM | POA: Diagnosis not present

## 2022-02-03 DIAGNOSIS — Z981 Arthrodesis status: Secondary | ICD-10-CM | POA: Diagnosis not present

## 2022-02-03 DIAGNOSIS — M19072 Primary osteoarthritis, left ankle and foot: Secondary | ICD-10-CM | POA: Diagnosis not present

## 2022-02-03 DIAGNOSIS — M216X2 Other acquired deformities of left foot: Secondary | ICD-10-CM | POA: Diagnosis not present

## 2022-02-14 DIAGNOSIS — E119 Type 2 diabetes mellitus without complications: Secondary | ICD-10-CM | POA: Diagnosis not present

## 2022-02-14 DIAGNOSIS — E782 Mixed hyperlipidemia: Secondary | ICD-10-CM | POA: Diagnosis not present

## 2022-02-14 DIAGNOSIS — Z9861 Coronary angioplasty status: Secondary | ICD-10-CM | POA: Diagnosis not present

## 2022-02-14 DIAGNOSIS — I2510 Atherosclerotic heart disease of native coronary artery without angina pectoris: Secondary | ICD-10-CM | POA: Diagnosis not present

## 2022-02-14 DIAGNOSIS — E669 Obesity, unspecified: Secondary | ICD-10-CM | POA: Diagnosis not present

## 2022-02-14 DIAGNOSIS — I10 Essential (primary) hypertension: Secondary | ICD-10-CM | POA: Diagnosis not present

## 2022-02-21 DIAGNOSIS — M19072 Primary osteoarthritis, left ankle and foot: Secondary | ICD-10-CM | POA: Diagnosis not present

## 2022-02-21 DIAGNOSIS — Z794 Long term (current) use of insulin: Secondary | ICD-10-CM | POA: Diagnosis not present

## 2022-02-21 DIAGNOSIS — E1165 Type 2 diabetes mellitus with hyperglycemia: Secondary | ICD-10-CM | POA: Diagnosis not present

## 2022-02-21 DIAGNOSIS — I10 Essential (primary) hypertension: Secondary | ICD-10-CM | POA: Diagnosis not present

## 2022-02-21 DIAGNOSIS — N183 Chronic kidney disease, stage 3 unspecified: Secondary | ICD-10-CM | POA: Diagnosis not present

## 2022-02-21 DIAGNOSIS — E118 Type 2 diabetes mellitus with unspecified complications: Secondary | ICD-10-CM | POA: Diagnosis not present

## 2022-02-21 DIAGNOSIS — Z01818 Encounter for other preprocedural examination: Secondary | ICD-10-CM | POA: Diagnosis not present

## 2022-02-21 DIAGNOSIS — N189 Chronic kidney disease, unspecified: Secondary | ICD-10-CM | POA: Diagnosis not present

## 2022-02-21 DIAGNOSIS — I2510 Atherosclerotic heart disease of native coronary artery without angina pectoris: Secondary | ICD-10-CM | POA: Diagnosis not present

## 2022-02-23 DIAGNOSIS — K5904 Chronic idiopathic constipation: Secondary | ICD-10-CM | POA: Diagnosis not present

## 2022-02-23 DIAGNOSIS — R112 Nausea with vomiting, unspecified: Secondary | ICD-10-CM | POA: Diagnosis not present

## 2022-03-16 DIAGNOSIS — M1990 Unspecified osteoarthritis, unspecified site: Secondary | ICD-10-CM | POA: Diagnosis not present

## 2022-03-16 DIAGNOSIS — I2510 Atherosclerotic heart disease of native coronary artery without angina pectoris: Secondary | ICD-10-CM | POA: Diagnosis not present

## 2022-03-16 DIAGNOSIS — H3500 Unspecified background retinopathy: Secondary | ICD-10-CM | POA: Diagnosis not present

## 2022-03-16 DIAGNOSIS — E785 Hyperlipidemia, unspecified: Secondary | ICD-10-CM | POA: Diagnosis not present

## 2022-03-16 DIAGNOSIS — I129 Hypertensive chronic kidney disease with stage 1 through stage 4 chronic kidney disease, or unspecified chronic kidney disease: Secondary | ICD-10-CM | POA: Diagnosis not present

## 2022-03-16 DIAGNOSIS — M25572 Pain in left ankle and joints of left foot: Secondary | ICD-10-CM | POA: Diagnosis not present

## 2022-03-16 DIAGNOSIS — M25372 Other instability, left ankle: Secondary | ICD-10-CM | POA: Diagnosis not present

## 2022-03-16 DIAGNOSIS — N1831 Chronic kidney disease, stage 3a: Secondary | ICD-10-CM | POA: Diagnosis not present

## 2022-03-16 DIAGNOSIS — E1122 Type 2 diabetes mellitus with diabetic chronic kidney disease: Secondary | ICD-10-CM | POA: Diagnosis not present

## 2022-03-16 DIAGNOSIS — K219 Gastro-esophageal reflux disease without esophagitis: Secondary | ICD-10-CM | POA: Diagnosis not present

## 2022-03-16 DIAGNOSIS — M19072 Primary osteoarthritis, left ankle and foot: Secondary | ICD-10-CM | POA: Diagnosis not present

## 2022-03-16 DIAGNOSIS — E1142 Type 2 diabetes mellitus with diabetic polyneuropathy: Secondary | ICD-10-CM | POA: Diagnosis not present

## 2022-03-16 DIAGNOSIS — Z981 Arthrodesis status: Secondary | ICD-10-CM | POA: Diagnosis not present

## 2022-03-28 DIAGNOSIS — E113293 Type 2 diabetes mellitus with mild nonproliferative diabetic retinopathy without macular edema, bilateral: Secondary | ICD-10-CM | POA: Diagnosis not present

## 2022-03-28 DIAGNOSIS — H43823 Vitreomacular adhesion, bilateral: Secondary | ICD-10-CM | POA: Diagnosis not present

## 2022-03-28 DIAGNOSIS — H35033 Hypertensive retinopathy, bilateral: Secondary | ICD-10-CM | POA: Diagnosis not present

## 2022-03-28 DIAGNOSIS — H2513 Age-related nuclear cataract, bilateral: Secondary | ICD-10-CM | POA: Diagnosis not present

## 2022-04-17 DIAGNOSIS — M25572 Pain in left ankle and joints of left foot: Secondary | ICD-10-CM | POA: Diagnosis not present

## 2022-04-20 DIAGNOSIS — K3184 Gastroparesis: Secondary | ICD-10-CM | POA: Diagnosis not present

## 2022-04-20 DIAGNOSIS — I25118 Atherosclerotic heart disease of native coronary artery with other forms of angina pectoris: Secondary | ICD-10-CM | POA: Diagnosis not present

## 2022-04-20 DIAGNOSIS — E1142 Type 2 diabetes mellitus with diabetic polyneuropathy: Secondary | ICD-10-CM | POA: Diagnosis not present

## 2022-04-20 DIAGNOSIS — N1831 Chronic kidney disease, stage 3a: Secondary | ICD-10-CM | POA: Diagnosis not present

## 2022-04-20 DIAGNOSIS — E7800 Pure hypercholesterolemia, unspecified: Secondary | ICD-10-CM | POA: Diagnosis not present

## 2022-04-20 DIAGNOSIS — I10 Essential (primary) hypertension: Secondary | ICD-10-CM | POA: Diagnosis not present

## 2022-04-21 DIAGNOSIS — Z9889 Other specified postprocedural states: Secondary | ICD-10-CM | POA: Diagnosis not present

## 2022-04-21 DIAGNOSIS — M79672 Pain in left foot: Secondary | ICD-10-CM | POA: Diagnosis not present

## 2022-04-21 DIAGNOSIS — M19072 Primary osteoarthritis, left ankle and foot: Secondary | ICD-10-CM | POA: Diagnosis not present

## 2022-04-27 DIAGNOSIS — N1831 Chronic kidney disease, stage 3a: Secondary | ICD-10-CM | POA: Diagnosis not present

## 2022-04-27 DIAGNOSIS — E7800 Pure hypercholesterolemia, unspecified: Secondary | ICD-10-CM | POA: Diagnosis not present

## 2022-04-27 DIAGNOSIS — I25118 Atherosclerotic heart disease of native coronary artery with other forms of angina pectoris: Secondary | ICD-10-CM | POA: Diagnosis not present

## 2022-04-27 DIAGNOSIS — K3184 Gastroparesis: Secondary | ICD-10-CM | POA: Diagnosis not present

## 2022-04-27 DIAGNOSIS — E1142 Type 2 diabetes mellitus with diabetic polyneuropathy: Secondary | ICD-10-CM | POA: Diagnosis not present

## 2022-04-27 DIAGNOSIS — I10 Essential (primary) hypertension: Secondary | ICD-10-CM | POA: Diagnosis not present

## 2022-07-19 ENCOUNTER — Ambulatory Visit (INDEPENDENT_AMBULATORY_CARE_PROVIDER_SITE_OTHER): Payer: 59 | Admitting: Physical Therapy

## 2022-07-19 ENCOUNTER — Other Ambulatory Visit: Payer: Self-pay

## 2022-07-19 ENCOUNTER — Encounter: Payer: Self-pay | Admitting: Physical Therapy

## 2022-07-19 DIAGNOSIS — M6281 Muscle weakness (generalized): Secondary | ICD-10-CM

## 2022-07-19 DIAGNOSIS — R2689 Other abnormalities of gait and mobility: Secondary | ICD-10-CM | POA: Diagnosis not present

## 2022-07-19 DIAGNOSIS — R600 Localized edema: Secondary | ICD-10-CM

## 2022-07-19 DIAGNOSIS — M25572 Pain in left ankle and joints of left foot: Secondary | ICD-10-CM

## 2022-07-19 DIAGNOSIS — M25675 Stiffness of left foot, not elsewhere classified: Secondary | ICD-10-CM | POA: Diagnosis not present

## 2022-07-19 NOTE — Therapy (Addendum)
OUTPATIENT PHYSICAL THERAPY LOWER EXTREMITY EVALUATION   Patient Name: Collin [name] MRN: [id number] DOB:05-05-1951, 71 y.o., male Today's Date: 07/19/2022  END OF SESSION:  PT End of Session - 07/19/22 0852     Visit Number 1    Number of Visits 16    Date for PT Re-Evaluation 10/11/22    PT Start Time 0850    PT Stop Time 0930    PT Time Calculation (min) 40 min    Activity Tolerance Patient tolerated treatment well    Behavior During Therapy [facility] for tasks assessed/performed             History reviewed. No pertinent past medical history. History reviewed. No pertinent surgical history. 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) 12/15/2021   Polyneuropathy due to type 2 diabetes mellitus (HCC) 12/15/2021   Planovalgus deformity of foot, acquired, left 03/30/2021   Hyperglycemia due to type 2 diabetes mellitus (HCC) 02/10/2021   Hyperlipidemia 05/23/2018   Essential hypertension 05/21/2018   Diabetes mellitus (HCC) 05/21/2018    PCP: Salli Real, MD   REFERRING PROVIDER: Elige Radon, PA-C  REFERRING DIAG: [phone] (ICD-10-CM) - Primary osteoarthritis, left ankle and foot  THERAPY DIAG:  Pain in left ankle and joints of left foot  Stiffness of left foot, not elsewhere classified  Other abnormalities of gait and mobility  Muscle weakness (generalized)  Localized edema  Rationale for Evaluation and Treatment: Rehabilitation  ONSET DATE: S/P Left ankle arthoplasty 03/16/22  SUBJECTIVE:   SUBJECTIVE STATEMENT: He is having ankle pain and stiffness along with difficulty for standing and walking after his ankle surgery.  PERTINENT HISTORY: Multiple left ankle surgeries/OA, DM with neuropathy, left planovalgus deformity of foot PAIN:  Are you having pain? Yes: NPRS scale: 3 at rest, 6 with walking/10 Pain location: left ankle Pain description:  ache, burning Aggravating factors: walking, standing to long Relieving factors: rest, ice  PRECAUTIONS: None  WEIGHT BEARING RESTRICTIONS: WBAT  FALLS:  Has patient fallen in last 6 months? Yes. Number of falls 2  LIVING ENVIRONMENT: Stairs: 7 steps  OCCUPATION: retired  PLOF: Independent  PATIENT GOALS: improve left ankle function and pain  NEXT MD VISIT:   OBJECTIVE:   DIAGNOSTIC FINDINGS:  06/30/22: Radiographs of the left ankle demonstrate TAA hardware in appropriate position. No evidence of fracture or hardware loosening. Arthrodesis hardware without evidence of failure, no persistent lucency. 1 broken screw noted medially which is chronic in nature  PATIENT SURVEYS:  FOTO Eval: 44% functional  COGNITION: Overall cognitive status: Within functional limits for tasks assessed     SENSATION: Light touch decreased some due to neuropathy  EDEMA:  Moderate edema in left anle  MUSCLE LENGTH: Heelcords tight on left ankle  POSTURE:   PALPATION:   LOWER EXTREMITY ROM:  Active ROM/PROM Right eval Left eval  Hip flexion    Hip extension    Hip abduction    Hip adduction    Hip internal rotation    Hip external rotation    Knee flexion    Knee extension    Ankle dorsiflexion  2/5  Ankle plantarflexion  20/35  Ankle inversion  5/10  Ankle eversion  5/10   (Blank rows = not tested)  LOWER EXTREMITY MMT:  MMT Right eval Left eval  Hip flexion    Hip extension    Hip abduction    Hip adduction  Hip internal rotation    Hip external rotation    Knee flexion    Knee extension    Ankle dorsiflexion  4-  Ankle plantarflexion  4-  Ankle inversion  4-  Ankle eversion  4-   (Blank rows = not tested)  LOWER EXTREMITY SPECIAL TESTS:    FUNCTIONAL TESTS:    GAIT: Eval Comments: ambulates in CAM boot with SPC for community distance, he can walk 10 feet without boot today or cane   TODAY'S TREATMENT:  Eval -HEP creation and review with trial  set perfomred, see below for details -Left ankle PROM with overpressure all planes -Vasopnuematic device X 10 min, high compression, 34 deg to Lt ankle    PATIENT EDUCATION: Education details: HEP, PT plan of care Person educated: Patient Education method: Explanation, Demonstration, Verbal cues, and Handouts Education comprehension: verbalized understanding and needs further education   HOME EXERCISE PROGRAM: Access Code: [email or web address] URL: [email or web address] Date: 07/19/2022 Prepared by: Ivery Quale  Exercises - Standing Gastroc Stretch at Counter  - 2-3 x daily - 6 x weekly - 1 sets - 3 reps - 30 hold - Standing Soleus Stretch at Counter  - 2-3 x daily - 6 x weekly - 1 sets - 3 reps - 30 hold - Side to Side Weight Shift with Counter Support  - 2-3 x daily - 6 x weekly - 1 sets - 15-20 reps - Staggered Stance Forward Backward Weight Shift with Counter Support  - 2-3 x daily - 6 x weekly - 1 sets - 15-20 reps - Heel Raises with Counter Support  - 2 x daily - 6 x weekly - 2-3 sets - 10 reps - Ankle Alphabet in Elevation  - 2 x daily - 6 x weekly - 3 sets - 10 reps - Supine Ankle Circles  - 2 x daily - 6 x weekly - 3 sets - 10 reps  ASSESSMENT:  CLINICAL IMPRESSION: Patient referred to PT for S/P Left ankel arthroplasty 03/16/22. Patient will benefit from skilled PT to address below impairments, limitations and improve overall function.  OBJECTIVE IMPAIRMENTS: decreased activity tolerance, difficulty walking, decreased balance, decreased endurance, decreased mobility, decreased ROM, decreased strength, impaired flexibility, impaired LE use, and pain.  ACTIVITY LIMITATIONS: bending, lifting, carry, locomotion, cleaning, community activity, driving, and or occupation  PERSONAL FACTORS:Multiple left ankle surgeries/OA, DM with neuropathy, left planovalgus deformity of foot  are also affecting patient's functional outcome.  REHAB POTENTIAL: Good  CLINICAL DECISION  MAKING: Stable/uncomplicated  EVALUATION COMPLEXITY: Low    GOALS: Short term PT Goals Target date: 08/16/2022   Pt will be I and compliant with HEP. Baseline:  Goal status: New Pt will decrease pain by 25% overall Baseline: Goal status: New  Long term PT goals Target date:10/11/2022   Pt will improve ROM to [facility] to improve functional mobility Baseline: Goal status: New Pt will improve Lt ankle strength to at least 5/5 MMT in supine to improve functional strength Baseline: Goal status: New Pt will improve FOTO to at least 48% functional to show improved function Baseline: Goal status: New Pt will reduce pain to overall less than 3/10 with usual activity  Baseline: Goal status: New Pt will be able to ambulate community distances at least 1000 ft WNL gait pattern without complaints and ambulate one flight of stairs Baseline: Goal status: New  PLAN: PT FREQUENCY: 1-2 times per week   PT DURATION: 8-12 weeks  PLANNED INTERVENTIONS (unless contraindicated): aquatic PT, Canalith repositioning, cryotherapy, Electrical stimulation, Iontophoresis  with 4 mg/ml dexamethasome, Moist heat, traction, Ultrasound, gait training, Therapeutic exercise, balance training, neuromuscular re-education, patient/family education, prosthetic training, manual techniques, passive ROM, dry needling, taping, vasopnuematic device, vestibular, spinal manipulations, joint manipulations  PLAN FOR NEXT SESSION: review HEP, Lt ankle ROM and strength, WBAT and wean out of boot    April Manson, PT,DPT 07/19/2022, 8:53 AM

## 2022-07-26 ENCOUNTER — Encounter: Payer: Self-pay | Admitting: Physical Therapy

## 2022-07-26 ENCOUNTER — Ambulatory Visit: Payer: 59 | Admitting: Physical Therapy

## 2022-07-26 DIAGNOSIS — M6281 Muscle weakness (generalized): Secondary | ICD-10-CM | POA: Diagnosis not present

## 2022-07-26 DIAGNOSIS — R2689 Other abnormalities of gait and mobility: Secondary | ICD-10-CM

## 2022-07-26 DIAGNOSIS — M25675 Stiffness of left foot, not elsewhere classified: Secondary | ICD-10-CM | POA: Diagnosis not present

## 2022-07-26 DIAGNOSIS — M25572 Pain in left ankle and joints of left foot: Secondary | ICD-10-CM | POA: Diagnosis not present

## 2022-07-26 DIAGNOSIS — R600 Localized edema: Secondary | ICD-10-CM

## 2022-07-26 NOTE — Therapy (Signed)
OUTPATIENT PHYSICAL THERAPY LOWER EXTREMITY EVALUATION   Patient Name: Collin [name] MRN: [id number] DOB:07/07/1951, 71 y.o., male Today's Date: 07/26/2022  END OF SESSION:  PT End of Session - 07/26/22 1153     Visit Number 2    Number of Visits 16    Date for PT Re-Evaluation 10/11/22    PT Start Time 1144    PT Stop Time 1230    PT Time Calculation (min) 46 min    Activity Tolerance Patient tolerated treatment well    Behavior During Therapy [facility] for tasks assessed/performed              History reviewed. No pertinent past medical history. History reviewed. No pertinent surgical history. 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) 12/15/2021   Polyneuropathy due to type 2 diabetes mellitus (HCC) 12/15/2021   Planovalgus deformity of foot, acquired, left 03/30/2021   Hyperglycemia due to type 2 diabetes mellitus (HCC) 02/10/2021   Hyperlipidemia 05/23/2018   Essential hypertension 05/21/2018   Diabetes mellitus (HCC) 05/21/2018    PCP: Salli Real, MD   REFERRING PROVIDER: Elige Radon, PA-C  REFERRING DIAG: [phone] (ICD-10-CM) - Primary osteoarthritis, left ankle and foot  THERAPY DIAG:  Pain in left ankle and joints of left foot  Stiffness of left foot, not elsewhere classified  Other abnormalities of gait and mobility  Muscle weakness (generalized)  Localized edema  Rationale for Evaluation and Treatment: Rehabilitation  ONSET DATE: S/P Left ankle arthoplasty 03/16/22  SUBJECTIVE:   SUBJECTIVE STATEMENT: He says the pain is not too bad, he has been weaning out of the boot and arrives today without it, wearing tennis shoes instead.   PERTINENT HISTORY: Multiple left ankle surgeries/OA, DM with neuropathy, left planovalgus deformity of foot PAIN:  Are you having pain? Yes: NPRS scale: 3 at rest, 6 with walking/10 Pain location: left  ankle Pain description: ache, burning Aggravating factors: walking, standing to long Relieving factors: rest, ice  PRECAUTIONS: None  WEIGHT BEARING RESTRICTIONS: WBAT  FALLS:  Has patient fallen in last 6 months? Yes. Number of falls 2  LIVING ENVIRONMENT: Stairs: 7 steps  OCCUPATION: retired  PLOF: Independent  PATIENT GOALS: improve left ankle function and pain  NEXT MD VISIT:   OBJECTIVE:   DIAGNOSTIC FINDINGS:  06/30/22: Radiographs of the left ankle demonstrate TAA hardware in appropriate position. No evidence of fracture or hardware loosening. Arthrodesis hardware without evidence of failure, no persistent lucency. 1 broken screw noted medially which is chronic in nature  PATIENT SURVEYS:  FOTO Eval: 44% functional  COGNITION: Overall cognitive status: Within functional limits for tasks assessed     SENSATION: Light touch decreased some due to neuropathy  EDEMA:  Moderate edema in left anle  MUSCLE LENGTH: Heelcords tight on left ankle  POSTURE:   PALPATION:   LOWER EXTREMITY ROM:  Active ROM/PROM Right eval Left eval  Hip flexion    Hip extension    Hip abduction    Hip adduction    Hip internal rotation    Hip external rotation    Knee flexion    Knee extension    Ankle dorsiflexion  2/5  Ankle plantarflexion  20/35  Ankle inversion  5/10  Ankle eversion  5/10   (Blank rows = not tested)  LOWER EXTREMITY MMT:  MMT Right eval Left eval  Hip flexion    Hip extension  Hip abduction    Hip adduction    Hip internal rotation    Hip external rotation    Knee flexion    Knee extension    Ankle dorsiflexion  4-  Ankle plantarflexion  4-  Ankle inversion  4-  Ankle eversion  4-   (Blank rows = not tested)  LOWER EXTREMITY SPECIAL TESTS:    FUNCTIONAL TESTS:    GAIT: Eval Comments: ambulates in CAM boot with SPC for community distance, he can walk 10 feet without boot today or cane   TODAY'S TREATMENT:   07/26/22 -Gastroc stretch and soleus stretch 3 X 30 sec each on slantboard -DF lunge stretch with left foot on slantboard 5 sec X 10 -Tandem balance left foot back 3 X 20 sec -heel and toe raises with light UE support 2X10 -Step up with left and down in front leading with Rt, 4 inch step X 10 -Leg press left leg only 75 # 2X10 -Seated BAPS L3 X 20 DF-PF, X 20 INV/EV, X 15 circles CW,CCW -ankle 4 way with red X 15 each  Manual therapy: Left ankle PROM with overpressure all planes, then DF mobs grade 1-2  -Vasopnuematic device X 10 min, high compression, 34 deg to Lt ankle    PATIENT EDUCATION: Education details: HEP, PT plan of care Person educated: Patient Education method: Explanation, Demonstration, Verbal cues, and Handouts Education comprehension: verbalized understanding and needs further education   HOME EXERCISE PROGRAM: Access Code: [email or web address] URL: [email or web address] Date: 07/19/2022 Prepared by: Ivery Quale  Exercises - Standing Gastroc Stretch at Counter  - 2-3 x daily - 6 x weekly - 1 sets - 3 reps - 30 hold - Standing Soleus Stretch at Counter  - 2-3 x daily - 6 x weekly - 1 sets - 3 reps - 30 hold - Side to Side Weight Shift with Counter Support  - 2-3 x daily - 6 x weekly - 1 sets - 15-20 reps - Staggered Stance Forward Backward Weight Shift with Counter Support  - 2-3 x daily - 6 x weekly - 1 sets - 15-20 reps - Heel Raises with Counter Support  - 2 x daily - 6 x weekly - 2-3 sets - 10 reps - Ankle Alphabet in Elevation  - 2 x daily - 6 x weekly - 3 sets - 10 reps - Supine Ankle Circles  - 2 x daily - 6 x weekly - 3 sets - 10 reps  ASSESSMENT:  CLINICAL IMPRESSION: He is weaning out of CAM boot and off of SPC. He was able to ambulate in regular tennis shoe independently today. We worked to improve his left ankle ROM and strength as tolerated and will monitor for any soreness from this.   OBJECTIVE IMPAIRMENTS: decreased activity tolerance,  difficulty walking, decreased balance, decreased endurance, decreased mobility, decreased ROM, decreased strength, impaired flexibility, impaired LE use, and pain.  ACTIVITY LIMITATIONS: bending, lifting, carry, locomotion, cleaning, community activity, driving, and or occupation  PERSONAL FACTORS:Multiple left ankle surgeries/OA, DM with neuropathy, left planovalgus deformity of foot  are also affecting patient's functional outcome.  REHAB POTENTIAL: Good  CLINICAL DECISION MAKING: Stable/uncomplicated  EVALUATION COMPLEXITY: Low    GOALS: Short term PT Goals Target date: 08/16/2022   Pt will be I and compliant with HEP. Baseline:  Goal status: New Pt will decrease pain by 25% overall Baseline: Goal status: New  Long term PT goals Target date:10/11/2022   Pt will improve ROM to [facility] to improve functional mobility Baseline:  Goal status: New Pt will improve Lt ankle strength to at least 5/5 MMT in supine to improve functional strength Baseline: Goal status: New Pt will improve FOTO to at least 48% functional to show improved function Baseline: Goal status: New Pt will reduce pain to overall less than 3/10 with usual activity  Baseline: Goal status: New Pt will be able to ambulate community distances at least 1000 ft WNL gait pattern without complaints and ambulate one flight of stairs Baseline: Goal status: New  PLAN: PT FREQUENCY: 1-2 times per week   PT DURATION: 8-12 weeks  PLANNED INTERVENTIONS (unless contraindicated): aquatic PT, Canalith repositioning, cryotherapy, Electrical stimulation, Iontophoresis with 4 mg/ml dexamethasome, Moist heat, traction, Ultrasound, gait training, Therapeutic exercise, balance training, neuromuscular re-education, patient/family education, prosthetic training, manual techniques, passive ROM, dry needling, taping, vasopnuematic device, vestibular, spinal manipulations, joint manipulations  PLAN FOR NEXT SESSION: Lt ankle ROM and  strength, WBAT and wean out of boot    April Manson, PT,DPT 07/26/2022, 11:55 AM

## 2022-07-28 ENCOUNTER — Ambulatory Visit: Payer: 59 | Admitting: Physical Therapy

## 2022-07-28 ENCOUNTER — Encounter: Payer: Self-pay | Admitting: Physical Therapy

## 2022-07-28 DIAGNOSIS — M25572 Pain in left ankle and joints of left foot: Secondary | ICD-10-CM

## 2022-07-28 DIAGNOSIS — M25675 Stiffness of left foot, not elsewhere classified: Secondary | ICD-10-CM

## 2022-07-28 DIAGNOSIS — R600 Localized edema: Secondary | ICD-10-CM

## 2022-07-28 DIAGNOSIS — R2689 Other abnormalities of gait and mobility: Secondary | ICD-10-CM | POA: Diagnosis not present

## 2022-07-28 DIAGNOSIS — M6281 Muscle weakness (generalized): Secondary | ICD-10-CM

## 2022-07-28 NOTE — Therapy (Signed)
OUTPATIENT PHYSICAL THERAPY LOWER EXTREMITY EVALUATION   Patient Name: Collin [name] MRN: [id number] DOB:18-Jan-1951, 71 y.o., male Today's Date: 07/28/2022  END OF SESSION:  PT End of Session - 07/28/22 1308     Visit Number 3    Number of Visits 16    Date for PT Re-Evaluation 10/11/22    PT Start Time 1300    PT Stop Time 1352    PT Time Calculation (min) 52 min    Activity Tolerance Patient tolerated treatment well    Behavior During Therapy [facility] for tasks assessed/performed              History reviewed. No pertinent past medical history. History reviewed. No pertinent surgical history. 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) 12/15/2021   Polyneuropathy due to type 2 diabetes mellitus (HCC) 12/15/2021   Planovalgus deformity of foot, acquired, left 03/30/2021   Hyperglycemia due to type 2 diabetes mellitus (HCC) 02/10/2021   Hyperlipidemia 05/23/2018   Essential hypertension 05/21/2018   Diabetes mellitus (HCC) 05/21/2018    PCP: Salli Real, MD   REFERRING PROVIDER: Elige Radon, PA-C  REFERRING DIAG: [phone] (ICD-10-CM) - Primary osteoarthritis, left ankle and foot  THERAPY DIAG:  Pain in left ankle and joints of left foot  Stiffness of left foot, not elsewhere classified  Other abnormalities of gait and mobility  Muscle weakness (generalized)  Localized edema  Rationale for Evaluation and Treatment: Rehabilitation  ONSET DATE: S/P Left ankle arthoplasty 03/16/22  SUBJECTIVE:   SUBJECTIVE STATEMENT: He says some soreness after walking around a lot yesterday, not too bad today, has a spot on the bottom of his foot he feels like it is like walking on a marble.   PERTINENT HISTORY: Multiple left ankle surgeries/OA, DM with neuropathy, left planovalgus deformity of foot PAIN:  Are you having pain? Yes: NPRS scale: 3 at rest, 5 with  walking/10 Pain location: left ankle Pain description: ache, burning Aggravating factors: walking, standing to long Relieving factors: rest, ice  PRECAUTIONS: None  WEIGHT BEARING RESTRICTIONS: WBAT  FALLS:  Has patient fallen in last 6 months? Yes. Number of falls 2  LIVING ENVIRONMENT: Stairs: 7 steps  OCCUPATION: retired  PLOF: Independent  PATIENT GOALS: improve left ankle function and pain  NEXT MD VISIT:   OBJECTIVE:   DIAGNOSTIC FINDINGS:  06/30/22: Radiographs of the left ankle demonstrate TAA hardware in appropriate position. No evidence of fracture or hardware loosening. Arthrodesis hardware without evidence of failure, no persistent lucency. 1 broken screw noted medially which is chronic in nature  PATIENT SURVEYS:  FOTO Eval: 44% functional  COGNITION: Overall cognitive status: Within functional limits for tasks assessed     SENSATION: Light touch decreased some due to neuropathy  EDEMA:  Moderate edema in left anle  MUSCLE LENGTH: Heelcords tight on left ankle  POSTURE:   PALPATION:   LOWER EXTREMITY ROM:  Active ROM/PROM Right eval Left eval  Hip flexion    Hip extension    Hip abduction    Hip adduction    Hip internal rotation    Hip external rotation    Knee flexion    Knee extension    Ankle dorsiflexion  2/5  Ankle plantarflexion  20/35  Ankle inversion  5/10  Ankle eversion  5/10   (Blank rows = not tested)  LOWER EXTREMITY MMT:  MMT Right eval Left eval  Hip  flexion    Hip extension    Hip abduction    Hip adduction    Hip internal rotation    Hip external rotation    Knee flexion    Knee extension    Ankle dorsiflexion  4-  Ankle plantarflexion  4-  Ankle inversion  4-  Ankle eversion  4-   (Blank rows = not tested)  LOWER EXTREMITY SPECIAL TESTS:    FUNCTIONAL TESTS:    GAIT: Eval Comments: ambulates in CAM boot with SPC for community distance, he can walk 10 feet without boot today or  cane   TODAY'S TREATMENT:  07/28/22 -Gastroc stretch and soleus stretch 3 X 30 sec each on slantboard -Tandem balance left foot back 3 X 30 sec -heel and toe raises with light UE support 2X10 -Step up with left and down in front leading with Rt, 4 inch step X 10 -Lateral step down from 6 inch step 2X10 -Leg press left leg only 75 # 2X10 -ankle rocker board X 30 A-P, X 30 lateral -walking up/down ramp for ankle ROM and strength X 3 round trips fwd and lateral each side -SLS fwd reaches X 10 -ankle 4 way with red X 20 each   -Vasopnuematic device X 10 min, high compression, 34 deg to Lt ankle    PATIENT EDUCATION: Education details: HEP, PT plan of care Person educated: Patient Education method: Explanation, Demonstration, Verbal cues, and Handouts Education comprehension: verbalized understanding and needs further education   HOME EXERCISE PROGRAM: Access Code: [email or web address] URL: [email or web address] Date: 07/19/2022 Prepared by: Ivery Quale  Exercises - Standing Gastroc Stretch at Counter  - 2-3 x daily - 6 x weekly - 1 sets - 3 reps - 30 hold - Standing Soleus Stretch at Counter  - 2-3 x daily - 6 x weekly - 1 sets - 3 reps - 30 hold - Side to Side Weight Shift with Counter Support  - 2-3 x daily - 6 x weekly - 1 sets - 15-20 reps - Staggered Stance Forward Backward Weight Shift with Counter Support  - 2-3 x daily - 6 x weekly - 1 sets - 15-20 reps - Heel Raises with Counter Support  - 2 x daily - 6 x weekly - 2-3 sets - 10 reps - Ankle Alphabet in Elevation  - 2 x daily - 6 x weekly - 3 sets - 10 reps - Supine Ankle Circles  - 2 x daily - 6 x weekly - 3 sets - 10 reps  ASSESSMENT:  CLINICAL IMPRESSION: He did not have too much soreness after last session so progressed his activity today focusing on ankle ROM, ankle strength, and SLS balance. He had good tolerance to session today.    OBJECTIVE IMPAIRMENTS: decreased activity tolerance, difficulty walking,  decreased balance, decreased endurance, decreased mobility, decreased ROM, decreased strength, impaired flexibility, impaired LE use, and pain.  ACTIVITY LIMITATIONS: bending, lifting, carry, locomotion, cleaning, community activity, driving, and or occupation  PERSONAL FACTORS:Multiple left ankle surgeries/OA, DM with neuropathy, left planovalgus deformity of foot  are also affecting patient's functional outcome.  REHAB POTENTIAL: Good  CLINICAL DECISION MAKING: Stable/uncomplicated  EVALUATION COMPLEXITY: Low    GOALS: Short term PT Goals Target date: 08/16/2022   Pt will be I and compliant with HEP. Baseline:  Goal status: New Pt will decrease pain by 25% overall Baseline: Goal status: New  Long term PT goals Target date:10/11/2022   Pt will improve ROM to [facility] to improve functional mobility Baseline: Goal  status: New Pt will improve Lt ankle strength to at least 5/5 MMT in supine to improve functional strength Baseline: Goal status: New Pt will improve FOTO to at least 48% functional to show improved function Baseline: Goal status: New Pt will reduce pain to overall less than 3/10 with usual activity  Baseline: Goal status: New Pt will be able to ambulate community distances at least 1000 ft WNL gait pattern without complaints and ambulate one flight of stairs Baseline: Goal status: New  PLAN: PT FREQUENCY: 1-2 times per week   PT DURATION: 8-12 weeks  PLANNED INTERVENTIONS (unless contraindicated): aquatic PT, Canalith repositioning, cryotherapy, Electrical stimulation, Iontophoresis with 4 mg/ml dexamethasome, Moist heat, traction, Ultrasound, gait training, Therapeutic exercise, balance training, neuromuscular re-education, patient/family education, prosthetic training, manual techniques, passive ROM, dry needling, taping, vasopnuematic device, vestibular, spinal manipulations, joint manipulations  PLAN FOR NEXT SESSION: Lt ankle ROM and strength as tolerated.      April Manson, PT,DPT 07/28/2022, 1:42 PM

## 2022-08-02 ENCOUNTER — Ambulatory Visit: Payer: 59 | Admitting: Physical Therapy

## 2022-08-02 ENCOUNTER — Encounter: Payer: Self-pay | Admitting: Physical Therapy

## 2022-08-02 DIAGNOSIS — M25675 Stiffness of left foot, not elsewhere classified: Secondary | ICD-10-CM

## 2022-08-02 DIAGNOSIS — M6281 Muscle weakness (generalized): Secondary | ICD-10-CM | POA: Diagnosis not present

## 2022-08-02 DIAGNOSIS — R2689 Other abnormalities of gait and mobility: Secondary | ICD-10-CM

## 2022-08-02 DIAGNOSIS — M25572 Pain in left ankle and joints of left foot: Secondary | ICD-10-CM | POA: Diagnosis not present

## 2022-08-02 DIAGNOSIS — R600 Localized edema: Secondary | ICD-10-CM

## 2022-08-02 NOTE — Therapy (Signed)
OUTPATIENT PHYSICAL THERAPY LOWER EXTREMITY EVALUATION   Patient Name: Collin [name] MRN: [id number] DOB:10-12-1950, 71 y.o., male Today's Date: 08/02/2022  END OF SESSION:  PT End of Session - 08/02/22 1326     Visit Number 4    Number of Visits 16    Date for PT Re-Evaluation 10/11/22    PT Start Time 1300    PT Stop Time 1350    PT Time Calculation (min) 50 min    Activity Tolerance Patient tolerated treatment well    Behavior During Therapy [facility] for tasks assessed/performed              History reviewed. No pertinent past medical history. History reviewed. No pertinent surgical history. 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) 12/15/2021   Polyneuropathy due to type 2 diabetes mellitus (HCC) 12/15/2021   Planovalgus deformity of foot, acquired, left 03/30/2021   Hyperglycemia due to type 2 diabetes mellitus (HCC) 02/10/2021   Hyperlipidemia 05/23/2018   Essential hypertension 05/21/2018   Diabetes mellitus (HCC) 05/21/2018    PCP: Salli Real, MD   REFERRING PROVIDER: Elige Radon, PA-C  REFERRING DIAG: [phone] (ICD-10-CM) - Primary osteoarthritis, left ankle and foot  THERAPY DIAG:  Pain in left ankle and joints of left foot  Stiffness of left foot, not elsewhere classified  Other abnormalities of gait and mobility  Muscle weakness (generalized)  Localized edema  Rationale for Evaluation and Treatment: Rehabilitation  ONSET DATE: S/P Left ankle arthoplasty 03/16/22  SUBJECTIVE:   SUBJECTIVE STATEMENT: He states not too much pain or soreness, he wants to push it a little more with his PT.  PERTINENT HISTORY: Multiple left ankle surgeries/OA, DM with neuropathy, left planovalgus deformity of foot PAIN:  Are you having pain? Yes: NPRS scale: 2 at rest, 3 with walking/10 Pain location: left ankle Pain description: ache,  burning Aggravating factors: walking, standing to long Relieving factors: rest, ice  PRECAUTIONS: None  WEIGHT BEARING RESTRICTIONS: WBAT  FALLS:  Has patient fallen in last 6 months? Yes. Number of falls 2  LIVING ENVIRONMENT: Stairs: 7 steps  OCCUPATION: retired  PLOF: Independent  PATIENT GOALS: improve left ankle function and pain  NEXT MD VISIT:   OBJECTIVE:   DIAGNOSTIC FINDINGS:  06/30/22: Radiographs of the left ankle demonstrate TAA hardware in appropriate position. No evidence of fracture or hardware loosening. Arthrodesis hardware without evidence of failure, no persistent lucency. 1 broken screw noted medially which is chronic in nature  PATIENT SURVEYS:  FOTO Eval: 44% functional  COGNITION: Overall cognitive status: Within functional limits for tasks assessed     SENSATION: Light touch decreased some due to neuropathy  EDEMA:  Moderate edema in left anle  MUSCLE LENGTH: Heelcords tight on left ankle  POSTURE:   PALPATION:   LOWER EXTREMITY ROM:  Active ROM/PROM Right eval Left eval  Hip flexion    Hip extension    Hip abduction    Hip adduction    Hip internal rotation    Hip external rotation    Knee flexion    Knee extension    Ankle dorsiflexion  2/5  Ankle plantarflexion  20/35  Ankle inversion  5/10  Ankle eversion  5/10   (Blank rows = not tested)  LOWER EXTREMITY MMT:  MMT Right eval Left eval  Hip flexion    Hip extension    Hip abduction    Hip  adduction    Hip internal rotation    Hip external rotation    Knee flexion    Knee extension    Ankle dorsiflexion  4-  Ankle plantarflexion  4-  Ankle inversion  4-  Ankle eversion  4-   (Blank rows = not tested)  LOWER EXTREMITY SPECIAL TESTS:    FUNCTIONAL TESTS:    GAIT: Eval Comments: ambulates in CAM boot with SPC for community distance, he can walk 10 feet without boot today or cane   TODAY'S TREATMENT:  08/02/22 -Nu step L6 LE only X 8 min -Gastroc  stretch and soleus stretch 3 X 30 sec each on slantboard -Eccentric heel raises (up with both down with left only) 2X10 -Tandem walk 3 round trips at counter top -Step up with left and down in front leading with Rt, 6 inch step X 15 -Lateral step down from 6 inch step X 15 -Leg press left leg only 75 # 2X15, left leg calf press 3X10 25# -Retro walking big steps working on push off 3 round trips at counter top -ankle PF with black band 2X10   -Vasopnuematic device X 10 min, high compression, 34 deg to Lt ankle    PATIENT EDUCATION: Education details: HEP, PT plan of care Person educated: Patient Education method: Explanation, Demonstration, Verbal cues, and Handouts Education comprehension: verbalized understanding and needs further education   HOME EXERCISE PROGRAM: Access Code: [email or web address] URL: [email or web address] Date: 07/19/2022 Prepared by: Ivery Quale  Exercises - Standing Gastroc Stretch at Counter  - 2-3 x daily - 6 x weekly - 1 sets - 3 reps - 30 hold - Standing Soleus Stretch at Counter  - 2-3 x daily - 6 x weekly - 1 sets - 3 reps - 30 hold - Side to Side Weight Shift with Counter Support  - 2-3 x daily - 6 x weekly - 1 sets - 15-20 reps - Staggered Stance Forward Backward Weight Shift with Counter Support  - 2-3 x daily - 6 x weekly - 1 sets - 15-20 reps - Heel Raises with Counter Support  - 2 x daily - 6 x weekly - 2-3 sets - 10 reps - Ankle Alphabet in Elevation  - 2 x daily - 6 x weekly - 3 sets - 10 reps - Supine Ankle Circles  - 2 x daily - 6 x weekly - 3 sets - 10 reps  ASSESSMENT:  CLINICAL IMPRESSION: He was not having increased pain or soreness to progressed his activity with good tolerance on his part focusing on left ankle  ROM and PF strength.    OBJECTIVE IMPAIRMENTS: decreased activity tolerance, difficulty walking, decreased balance, decreased endurance, decreased mobility, decreased ROM, decreased strength, impaired flexibility,  impaired LE use, and pain.  ACTIVITY LIMITATIONS: bending, lifting, carry, locomotion, cleaning, community activity, driving, and or occupation  PERSONAL FACTORS:Multiple left ankle surgeries/OA, DM with neuropathy, left planovalgus deformity of foot  are also affecting patient's functional outcome.  REHAB POTENTIAL: Good  CLINICAL DECISION MAKING: Stable/uncomplicated  EVALUATION COMPLEXITY: Low    GOALS: Short term PT Goals Target date: 08/16/2022   Pt will be I and compliant with HEP. Baseline:  Goal status: New Pt will decrease pain by 25% overall Baseline: Goal status: New  Long term PT goals Target date:10/11/2022   Pt will improve ROM to [facility] to improve functional mobility Baseline: Goal status: New Pt will improve Lt ankle strength to at least 5/5 MMT in supine to improve functional strength Baseline: Goal status:  New Pt will improve FOTO to at least 48% functional to show improved function Baseline: Goal status: New Pt will reduce pain to overall less than 3/10 with usual activity  Baseline: Goal status: New Pt will be able to ambulate community distances at least 1000 ft WNL gait pattern without complaints and ambulate one flight of stairs Baseline: Goal status: New  PLAN: PT FREQUENCY: 1-2 times per week   PT DURATION: 8-12 weeks  PLANNED INTERVENTIONS (unless contraindicated): aquatic PT, Canalith repositioning, cryotherapy, Electrical stimulation, Iontophoresis with 4 mg/ml dexamethasome, Moist heat, traction, Ultrasound, gait training, Therapeutic exercise, balance training, neuromuscular re-education, patient/family education, prosthetic training, manual techniques, passive ROM, dry needling, taping, vasopnuematic device, vestibular, spinal manipulations, joint manipulations  PLAN FOR NEXT SESSION: Lt ankle ROM and strength as tolerated.     April Manson, PT,DPT 08/02/2022, 1:29 PM

## 2022-08-04 ENCOUNTER — Ambulatory Visit: Payer: 59 | Admitting: Physical Therapy

## 2022-08-04 DIAGNOSIS — R2689 Other abnormalities of gait and mobility: Secondary | ICD-10-CM

## 2022-08-04 DIAGNOSIS — M25675 Stiffness of left foot, not elsewhere classified: Secondary | ICD-10-CM

## 2022-08-04 DIAGNOSIS — R600 Localized edema: Secondary | ICD-10-CM

## 2022-08-04 DIAGNOSIS — M6281 Muscle weakness (generalized): Secondary | ICD-10-CM

## 2022-08-04 DIAGNOSIS — M25572 Pain in left ankle and joints of left foot: Secondary | ICD-10-CM

## 2022-08-04 NOTE — Therapy (Signed)
OUTPATIENT PHYSICAL THERAPY LOWER EXTREMITY EVALUATION   Patient Name: Collin [name] MRN: [id number] DOB:1951/04/10, 71 y.o., male Today's Date: 08/04/2022  END OF SESSION:  PT End of Session - 08/04/22 0942     Visit Number 5    Number of Visits 16    Date for PT Re-Evaluation 10/11/22    PT Start Time 0930    PT Stop Time 1018    PT Time Calculation (min) 48 min    Activity Tolerance Patient tolerated treatment well    Behavior During Therapy [facility] for tasks assessed/performed              No past medical history on file. No past surgical history on file. 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 ([place]) 12/15/2021   Polyneuropathy due to type 2 diabetes mellitus ([place]) 12/15/2021   Planovalgus deformity of foot, acquired, left 03/30/2021   Hyperglycemia due to type 2 diabetes mellitus ([place]) 02/10/2021   Hyperlipidemia 05/23/2018   Essential hypertension 05/21/2018   Diabetes mellitus ([place]) 05/21/2018    PCP: Sandi Mariscal, MD   REFERRING PROVIDER: Teresita Madura, PA-C  REFERRING DIAG: [phone] (ICD-10-CM) - Primary osteoarthritis, left ankle and foot  THERAPY DIAG:  Pain in left ankle and joints of left foot  Stiffness of left foot, not elsewhere classified  Other abnormalities of gait and mobility  Muscle weakness (generalized)  Localized edema  Rationale for Evaluation and Treatment: Rehabilitation  ONSET DATE: S/P Left ankle arthoplasty 03/16/22  SUBJECTIVE:   SUBJECTIVE STATEMENT: He states some pain and soreness in his ankle but not bad  PERTINENT HISTORY: Multiple left ankle surgeries/OA, DM with neuropathy, left planovalgus deformity of foot PAIN:  Are you having pain? Yes: NPRS scale: 2-3/10 Pain location: left ankle Pain description: ache, sore Aggravating factors: walking, standing to long Relieving factors: rest, ice  PRECAUTIONS:  None  WEIGHT BEARING RESTRICTIONS: WBAT  FALLS:  Has patient fallen in last 6 months? Yes. Number of falls 2  LIVING ENVIRONMENT: Stairs: 7 steps  OCCUPATION: retired  PLOF: Independent  PATIENT GOALS: improve left ankle function and pain  NEXT MD VISIT:   OBJECTIVE:   DIAGNOSTIC FINDINGS:  06/30/22: Radiographs of the left ankle demonstrate TAA hardware in appropriate position. No evidence of fracture or hardware loosening. Arthrodesis hardware without evidence of failure, no persistent lucency. 1 broken screw noted medially which is chronic in nature  PATIENT SURVEYS:  FOTO Eval: 44% functional  COGNITION: Overall cognitive status: Within functional limits for tasks assessed     SENSATION: Light touch decreased some due to neuropathy  EDEMA:  Moderate edema in left anle  MUSCLE LENGTH: Heelcords tight on left ankle  POSTURE:   PALPATION:   LOWER EXTREMITY ROM:  Active ROM/PROM Right eval Left eval  Hip flexion    Hip extension    Hip abduction    Hip adduction    Hip internal rotation    Hip external rotation    Knee flexion    Knee extension    Ankle dorsiflexion  2/5  Ankle plantarflexion  20/35  Ankle inversion  5/10  Ankle eversion  5/10   (Blank rows = not tested)  LOWER EXTREMITY MMT:  MMT Right eval Left eval  Hip flexion    Hip extension    Hip abduction    Hip adduction    Hip internal rotation    Hip external rotation  Knee flexion    Knee extension    Ankle dorsiflexion  4-  Ankle plantarflexion  4-  Ankle inversion  4-  Ankle eversion  4-   (Blank rows = not tested)  LOWER EXTREMITY SPECIAL TESTS:    FUNCTIONAL TESTS:    GAIT: Eval Comments: ambulates in CAM boot with SPC for community distance, he can walk 10 feet without boot today or cane   TODAY'S TREATMENT:  08/04/22 -Nu step L6 LE only X 8 min -Gastroc stretch and soleus stretch 3 X 30 sec each on slantboard -Eccentric heel raises (up with both down  with left only) 2X10 -Step down in front leading with Rt, 6 inch step 2 X 10 -Left ankle DF lunge stretch with foot on 6 inch step 5 sec 2X10 -Leg press DL 125# 2X10, single leg 75 # 2X10 bilat,  left leg calf press 3X10 25# -Retro walking big steps working on push off 3 round trips at counter top -Tandem walk 3 round trips at counter top -standing on black rocker board PF-DF stretching 5 sec each way for 2 min  -Vasopnuematic device X 10 min, high compression, 34 deg to Lt ankle    PATIENT EDUCATION: Education details: HEP, PT plan of care Person educated: Patient Education method: Explanation, Demonstration, Verbal cues, and Handouts Education comprehension: verbalized understanding and needs further education   HOME EXERCISE PROGRAM: Access Code: [email or web address] URL: [email or web address] Date: 07/19/2022 Prepared by: Elsie Ra  Exercises - Standing Gastroc Stretch at Counter  - 2-3 x daily - 6 x weekly - 1 sets - 3 reps - 30 hold - Standing Soleus Stretch at Counter  - 2-3 x daily - 6 x weekly - 1 sets - 3 reps - 30 hold - Side to Side Weight Shift with Counter Support  - 2-3 x daily - 6 x weekly - 1 sets - 15-20 reps - Staggered Stance Forward Backward Weight Shift with Counter Support  - 2-3 x daily - 6 x weekly - 1 sets - 15-20 reps - Heel Raises with Counter Support  - 2 x daily - 6 x weekly - 2-3 sets - 10 reps - Ankle Alphabet in Elevation  - 2 x daily - 6 x weekly - 3 sets - 10 reps - Supine Ankle Circles  - 2 x daily - 6 x weekly - 3 sets - 10 reps  ASSESSMENT:  CLINICAL IMPRESSION: He had some mild soreness but wanted to keep up the intensity with his exercises for ankle ROM and strength. He had good tolerance to this again today. He has now met his short term PT goals. PT recommending to continue current plan.    OBJECTIVE IMPAIRMENTS: decreased activity tolerance, difficulty walking, decreased balance, decreased endurance, decreased mobility, decreased  ROM, decreased strength, impaired flexibility, impaired LE use, and pain.  ACTIVITY LIMITATIONS: bending, lifting, carry, locomotion, cleaning, community activity, driving, and or occupation  PERSONAL FACTORS:Multiple left ankle surgeries/OA, DM with neuropathy, left planovalgus deformity of foot  are also affecting patient's functional outcome.  REHAB POTENTIAL: Good  CLINICAL DECISION MAKING: Stable/uncomplicated  EVALUATION COMPLEXITY: Low    GOALS: Short term PT Goals Target date: 08/16/2022   Pt will be I and compliant with HEP. Baseline:  Goal status: MET Pt will decrease pain by 25% overall Baseline: Goal status: MET  Long term PT goals Target date:10/11/2022   Pt will improve ROM to [facility] to improve functional mobility Baseline: Goal status: New Pt will improve Lt ankle strength to  at least 5/5 MMT in supine to improve functional strength Baseline: Goal status: New Pt will improve FOTO to at least 48% functional to show improved function Baseline: Goal status: New Pt will reduce pain to overall less than 3/10 with usual activity  Baseline: Goal status: New Pt will be able to ambulate community distances at least 1000 ft WNL gait pattern without complaints and ambulate one flight of stairs Baseline: Goal status: New  PLAN: PT FREQUENCY: 1-2 times per week   PT DURATION: 8-12 weeks  PLANNED INTERVENTIONS (unless contraindicated): aquatic PT, Canalith repositioning, cryotherapy, Electrical stimulation, Iontophoresis with 4 mg/ml dexamethasome, Moist heat, traction, Ultrasound, gait training, Therapeutic exercise, balance training, neuromuscular re-education, patient/family education, prosthetic training, manual techniques, passive ROM, dry needling, taping, vasopnuematic device, vestibular, spinal manipulations, joint manipulations  PLAN FOR NEXT SESSION: Lt ankle ROM and strength as tolerated.     Debbe Odea, PT,DPT 08/04/2022, 9:44 AM

## 2022-08-08 ENCOUNTER — Ambulatory Visit: Payer: 59 | Admitting: Physical Therapy

## 2022-08-08 ENCOUNTER — Encounter: Payer: Self-pay | Admitting: Physical Therapy

## 2022-08-08 DIAGNOSIS — M6281 Muscle weakness (generalized): Secondary | ICD-10-CM | POA: Diagnosis not present

## 2022-08-08 DIAGNOSIS — M25675 Stiffness of left foot, not elsewhere classified: Secondary | ICD-10-CM | POA: Diagnosis not present

## 2022-08-08 DIAGNOSIS — R2689 Other abnormalities of gait and mobility: Secondary | ICD-10-CM | POA: Diagnosis not present

## 2022-08-08 DIAGNOSIS — M25572 Pain in left ankle and joints of left foot: Secondary | ICD-10-CM | POA: Diagnosis not present

## 2022-08-08 DIAGNOSIS — R600 Localized edema: Secondary | ICD-10-CM

## 2022-08-08 NOTE — Therapy (Signed)
OUTPATIENT PHYSICAL THERAPY LOWER EXTREMITY EVALUATION   Patient Name: Collin [name] MRN: [id number] DOB:01-Mar-1951, 71 y.o., male Today's Date: 08/08/2022  END OF SESSION:  PT End of Session - 08/08/22 1305     Visit Number 6    Number of Visits 16    Date for PT Re-Evaluation 10/11/22    PT Start Time 1300    PT Stop Time 1348    PT Time Calculation (min) 48 min    Activity Tolerance Patient tolerated treatment well    Behavior During Therapy [facility] for tasks assessed/performed              History reviewed. No pertinent past medical history. History reviewed. No pertinent surgical history. 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 ([place]) 12/15/2021   Polyneuropathy due to type 2 diabetes mellitus ([place]) 12/15/2021   Planovalgus deformity of foot, acquired, left 03/30/2021   Hyperglycemia due to type 2 diabetes mellitus ([place]) 02/10/2021   Hyperlipidemia 05/23/2018   Essential hypertension 05/21/2018   Diabetes mellitus ([place]) 05/21/2018    PCP: Sandi Mariscal, MD   REFERRING PROVIDER: Teresita Madura, PA-C  REFERRING DIAG: [phone] (ICD-10-CM) - Primary osteoarthritis, left ankle and foot  THERAPY DIAG:  Pain in left ankle and joints of left foot  Stiffness of left foot, not elsewhere classified  Other abnormalities of gait and mobility  Muscle weakness (generalized)  Localized edema  Rationale for Evaluation and Treatment: Rehabilitation  ONSET DATE: S/P Left ankle arthoplasty 03/16/22  SUBJECTIVE:   SUBJECTIVE STATEMENT: He states some pain and soreness after pushing golf cart that was stuck. Not bad today and down to a 2-3/10  PERTINENT HISTORY: Multiple left ankle surgeries/OA, DM with neuropathy, left planovalgus deformity of foot PAIN:  Are you having pain? Yes: NPRS scale: 2-3/10 Pain location: left ankle Pain description: ache, sore Aggravating  factors: walking, standing to long Relieving factors: rest, ice  PRECAUTIONS: None  WEIGHT BEARING RESTRICTIONS: WBAT  FALLS:  Has patient fallen in last 6 months? Yes. Number of falls 2  LIVING ENVIRONMENT: Stairs: 7 steps  OCCUPATION: retired  PLOF: Independent  PATIENT GOALS: improve left ankle function and pain  NEXT MD VISIT:   OBJECTIVE:   DIAGNOSTIC FINDINGS:  06/30/22: Radiographs of the left ankle demonstrate TAA hardware in appropriate position. No evidence of fracture or hardware loosening. Arthrodesis hardware without evidence of failure, no persistent lucency. 1 broken screw noted medially which is chronic in nature  PATIENT SURVEYS:  FOTO Eval: 44% functional  COGNITION: Overall cognitive status: Within functional limits for tasks assessed     SENSATION: Light touch decreased some due to neuropathy  EDEMA:  Moderate edema in left anle  MUSCLE LENGTH: Heelcords tight on left ankle  POSTURE:   PALPATION:   LOWER EXTREMITY ROM:  Active ROM/PROM Right eval Left eval  Hip flexion    Hip extension    Hip abduction    Hip adduction    Hip internal rotation    Hip external rotation    Knee flexion    Knee extension    Ankle dorsiflexion  2/5  Ankle plantarflexion  20/35  Ankle inversion  5/10  Ankle eversion  5/10   (Blank rows = not tested)  LOWER EXTREMITY MMT:  MMT Left eval Right 08/08/22  Hip flexion    Hip extension    Hip abduction    Hip adduction  Hip internal rotation    Hip external rotation    Knee flexion    Knee extension    Ankle dorsiflexion 4- 4+  Ankle plantarflexion 4- 4  Ankle inversion 4- 5  Ankle eversion 4- 5   (Blank rows = not tested)  LOWER EXTREMITY SPECIAL TESTS:    FUNCTIONAL TESTS:    GAIT: Eval Comments: ambulates in CAM boot with SPC for community distance, he can walk 10 feet without boot today or cane   TODAY'S TREATMENT:  08/08/22 -Recumbent bike L3  X 8 min -Gastroc stretch and  soleus stretch 3 X 30 sec each on slantboard -Eccentric heel raises (up with both down with left only) 2X10 -Step down in front leading with Rt, 6 inch step 2 X 10 -Left ankle DF lunge stretch with foot on 6 inch step 5 sec X15 -Leg press DL 125# 2X10, single leg 75 # 2X10 bilat,  left leg calf press 3X10 25# -Retro walking big steps working on push off 3 round trips at counter top -Tandem walk 3 round trips at counter top -heel walking 3 round trips at counter top -Toe walking 3 round trips at counter top (fatigued with this so do this first next time)  -Vasopnuematic device X 10 min, high compression, 34 deg to Lt ankle    PATIENT EDUCATION: Education details: HEP, PT plan of care Person educated: Patient Education method: Explanation, Demonstration, Verbal cues, and Handouts Education comprehension: verbalized understanding and needs further education   HOME EXERCISE PROGRAM: Access Code: [email or web address] URL: [email or web address] Date: 07/19/2022 Prepared by: Elsie Ra  Exercises - Standing Gastroc Stretch at Counter  - 2-3 x daily - 6 x weekly - 1 sets - 3 reps - 30 hold - Standing Soleus Stretch at Counter  - 2-3 x daily - 6 x weekly - 1 sets - 3 reps - 30 hold - Side to Side Weight Shift with Counter Support  - 2-3 x daily - 6 x weekly - 1 sets - 15-20 reps - Staggered Stance Forward Backward Weight Shift with Counter Support  - 2-3 x daily - 6 x weekly - 1 sets - 15-20 reps - Heel Raises with Counter Support  - 2 x daily - 6 x weekly - 2-3 sets - 10 reps - Ankle Alphabet in Elevation  - 2 x daily - 6 x weekly - 3 sets - 10 reps - Supine Ankle Circles  - 2 x daily - 6 x weekly - 3 sets - 10 reps  ASSESSMENT:  CLINICAL IMPRESSION: He showed improved overall left ankle strength with updated measurements. He still has some functional weakness for left PF strength and we will work hard to progress this with PT.  PT recommending to continue current plan.     OBJECTIVE IMPAIRMENTS: decreased activity tolerance, difficulty walking, decreased balance, decreased endurance, decreased mobility, decreased ROM, decreased strength, impaired flexibility, impaired LE use, and pain.  ACTIVITY LIMITATIONS: bending, lifting, carry, locomotion, cleaning, community activity, driving, and or occupation  PERSONAL FACTORS:Multiple left ankle surgeries/OA, DM with neuropathy, left planovalgus deformity of foot  are also affecting patient's functional outcome.  REHAB POTENTIAL: Good  CLINICAL DECISION MAKING: Stable/uncomplicated  EVALUATION COMPLEXITY: Low    GOALS: Short term PT Goals Target date: 08/16/2022   Pt will be I and compliant with HEP. Baseline:  Goal status: MET Pt will decrease pain by 25% overall Baseline: Goal status: MET  Long term PT goals Target date:10/11/2022   Pt will improve ROM to  WFL to improve functional mobility Baseline: Goal status: New Pt will improve Lt ankle strength to at least 5/5 MMT in supine to improve functional strength Baseline: Goal status: New Pt will improve FOTO to at least 48% functional to show improved function Baseline: Goal status: New Pt will reduce pain to overall less than 3/10 with usual activity  Baseline: Goal status: New Pt will be able to ambulate community distances at least 1000 ft WNL gait pattern without complaints and ambulate one flight of stairs Baseline: Goal status: New  PLAN: PT FREQUENCY: 1-2 times per week   PT DURATION: 8-12 weeks  PLANNED INTERVENTIONS (unless contraindicated): aquatic PT, Canalith repositioning, cryotherapy, Electrical stimulation, Iontophoresis with 4 mg/ml dexamethasome, Moist heat, traction, Ultrasound, gait training, Therapeutic exercise, balance training, neuromuscular re-education, patient/family education, prosthetic training, manual techniques, passive ROM, dry needling, taping, vasopnuematic device, vestibular, spinal manipulations, joint  manipulations  PLAN FOR NEXT SESSION: Toe walking first as this is the hardest for his so do it before he gets fatigued. Lt ankle ROM and strength as tolerated.     Debbe Odea, PT,DPT 08/08/2022, 1:05 PM

## 2022-08-10 ENCOUNTER — Encounter: Payer: Self-pay | Admitting: Physical Therapy

## 2022-08-10 ENCOUNTER — Ambulatory Visit: Payer: 59 | Admitting: Physical Therapy

## 2022-08-10 DIAGNOSIS — M25572 Pain in left ankle and joints of left foot: Secondary | ICD-10-CM | POA: Diagnosis not present

## 2022-08-10 DIAGNOSIS — R2689 Other abnormalities of gait and mobility: Secondary | ICD-10-CM

## 2022-08-10 DIAGNOSIS — M25675 Stiffness of left foot, not elsewhere classified: Secondary | ICD-10-CM

## 2022-08-10 DIAGNOSIS — M6281 Muscle weakness (generalized): Secondary | ICD-10-CM

## 2022-08-10 DIAGNOSIS — R600 Localized edema: Secondary | ICD-10-CM

## 2022-08-10 NOTE — Therapy (Signed)
OUTPATIENT PHYSICAL THERAPY LOWER EXTREMITY EVALUATION   Patient Name: Collin [name] MRN: [id number] DOB:Apr 02, 1951, 71 y.o., male Today's Date: 08/10/2022  END OF SESSION:  PT End of Session - 08/10/22 0942     Visit Number 7    Number of Visits 16    Date for PT Re-Evaluation 10/11/22    PT Start Time 0930    PT Stop Time 1018    PT Time Calculation (min) 48 min    Activity Tolerance Patient tolerated treatment well    Behavior During Therapy [facility] for tasks assessed/performed              History reviewed. No pertinent past medical history. History reviewed. No pertinent surgical history. 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 ([place]) 12/15/2021   Polyneuropathy due to type 2 diabetes mellitus ([place]) 12/15/2021   Planovalgus deformity of foot, acquired, left 03/30/2021   Hyperglycemia due to type 2 diabetes mellitus ([place]) 02/10/2021   Hyperlipidemia 05/23/2018   Essential hypertension 05/21/2018   Diabetes mellitus ([place]) 05/21/2018    PCP: Sandi Mariscal, MD   REFERRING PROVIDER: Teresita Madura, PA-C  REFERRING DIAG: [phone] (ICD-10-CM) - Primary osteoarthritis, left ankle and foot  THERAPY DIAG:  Pain in left ankle and joints of left foot  Stiffness of left foot, not elsewhere classified  Other abnormalities of gait and mobility  Muscle weakness (generalized)  Localized edema  Rationale for Evaluation and Treatment: Rehabilitation  ONSET DATE: S/P Left ankle arthoplasty 03/16/22  SUBJECTIVE:   SUBJECTIVE STATEMENT: He states some soreness overall from a lot of walking.   PERTINENT HISTORY: Multiple left ankle surgeries/OA, DM with neuropathy, left planovalgus deformity of foot PAIN:  Are you having pain? Yes: NPRS scale: 2-3/10 Pain location: left ankle Pain description: ache, sore Aggravating factors: walking, standing to long Relieving  factors: rest, ice  PRECAUTIONS: None  WEIGHT BEARING RESTRICTIONS: WBAT  FALLS:  Has patient fallen in last 6 months? Yes. Number of falls 2  LIVING ENVIRONMENT: Stairs: 7 steps  OCCUPATION: retired  PLOF: Independent  PATIENT GOALS: improve left ankle function and pain  NEXT MD VISIT:   OBJECTIVE:   DIAGNOSTIC FINDINGS:  06/30/22: Radiographs of the left ankle demonstrate TAA hardware in appropriate position. No evidence of fracture or hardware loosening. Arthrodesis hardware without evidence of failure, no persistent lucency. 1 broken screw noted medially which is chronic in nature  PATIENT SURVEYS:  FOTO Eval: 44% functional  COGNITION: Overall cognitive status: Within functional limits for tasks assessed     SENSATION: Light touch decreased some due to neuropathy  EDEMA:  Moderate edema in left anle  MUSCLE LENGTH: Heelcords tight on left ankle  POSTURE:   PALPATION:   LOWER EXTREMITY ROM:  Active ROM/PROM Right eval Left eval Left 08/10/22  Hip flexion     Hip extension     Hip abduction     Hip adduction     Hip internal rotation     Hip external rotation     Knee flexion     Knee extension     Ankle dorsiflexion  2/5 6/  Ankle plantarflexion  20/35 25  Ankle inversion  5/10 10  Ankle eversion  5/10 15   (Blank rows = not tested)  LOWER EXTREMITY MMT:  MMT Left eval Right 08/08/22  Hip flexion    Hip extension    Hip abduction  Hip adduction    Hip internal rotation    Hip external rotation    Knee flexion    Knee extension    Ankle dorsiflexion 4- 4+  Ankle plantarflexion 4- 4  Ankle inversion 4- 5  Ankle eversion 4- 5   (Blank rows = not tested)  LOWER EXTREMITY SPECIAL TESTS:    FUNCTIONAL TESTS:    GAIT: Eval Comments: ambulates in CAM boot with SPC for community distance, he can walk 10 feet without boot today or cane   TODAY'S TREATMENT:  08/10/22 -Recumbent bike L3  X 8 min -Gastroc stretch and soleus  stretch 3 X 30 sec each on slantboard -Tandem walk 3 round trips at counter top -heel walking 3 round trips at counter top -Toe walking 3 round trips at counter top  -Eccentric heel raises (up with both down with left only) 2X10 -Step down in front leading with Rt, 6 inch step 2 X 10 -Left ankle DF lunge stretch with foot on 6 inch step 5 sec X15 -Leg press DL 125# 2X10, single leg 75 # 2X10 bilat,  left leg calf press 3X10 25# -Retro walking big steps working on push off 3 round trips at counter top -updated ROM measurements   -Vasopnuematic device X 10 min, high compression, 34 deg to Lt ankle    PATIENT EDUCATION: Education details: HEP, PT plan of care Person educated: Patient Education method: Explanation, Demonstration, Verbal cues, and Handouts Education comprehension: verbalized understanding and needs further education   HOME EXERCISE PROGRAM: Access Code: [email or web address] URL: [email or web address] Date: 07/19/2022 Prepared by: Elsie Ra  Exercises - Standing Gastroc Stretch at Counter  - 2-3 x daily - 6 x weekly - 1 sets - 3 reps - 30 hold - Standing Soleus Stretch at Counter  - 2-3 x daily - 6 x weekly - 1 sets - 3 reps - 30 hold - Side to Side Weight Shift with Counter Support  - 2-3 x daily - 6 x weekly - 1 sets - 15-20 reps - Staggered Stance Forward Backward Weight Shift with Counter Support  - 2-3 x daily - 6 x weekly - 1 sets - 15-20 reps - Heel Raises with Counter Support  - 2 x daily - 6 x weekly - 2-3 sets - 10 reps - A  ASSESSMENT:  CLINICAL IMPRESSION: updated ROM measurements show good overall progress with PT. Still lacking some ROM and ankle PF strength that we will continue to work to maximize in PT.   OBJECTIVE IMPAIRMENTS: decreased activity tolerance, difficulty walking, decreased balance, decreased endurance, decreased mobility, decreased ROM, decreased strength, impaired flexibility, impaired LE use, and pain.  ACTIVITY LIMITATIONS:  bending, lifting, carry, locomotion, cleaning, community activity, driving, and or occupation  PERSONAL FACTORS:Multiple left ankle surgeries/OA, DM with neuropathy, left planovalgus deformity of foot  are also affecting patient's functional outcome.  REHAB POTENTIAL: Good  CLINICAL DECISION MAKING: Stable/uncomplicated  EVALUATION COMPLEXITY: Low    GOALS: Short term PT Goals Target date: 08/16/2022   Pt will be I and compliant with HEP. Baseline:  Goal status: MET Pt will decrease pain by 25% overall Baseline: Goal status: MET  Long term PT goals Target date:10/11/2022   Pt will improve ROM to [facility] to improve functional mobility Baseline: Goal status: New Pt will improve Lt ankle strength to at least 5/5 MMT in supine to improve functional strength Baseline: Goal status: New Pt will improve FOTO to at least 48% functional to show improved function Baseline: Goal status: New  Pt will reduce pain to overall less than 3/10 with usual activity  Baseline: Goal status: New Pt will be able to ambulate community distances at least 1000 ft WNL gait pattern without complaints and ambulate one flight of stairs Baseline: Goal status: New  PLAN: PT FREQUENCY: 1-2 times per week   PT DURATION: 8-12 weeks  PLANNED INTERVENTIONS (unless contraindicated): aquatic PT, Canalith repositioning, cryotherapy, Electrical stimulation, Iontophoresis with 4 mg/ml dexamethasome, Moist heat, traction, Ultrasound, gait training, Therapeutic exercise, balance training, neuromuscular re-education, patient/family education, prosthetic training, manual techniques, passive ROM, dry needling, taping, vasopnuematic device, vestibular, spinal manipulations, joint manipulations  PLAN FOR NEXT SESSION: Toe walking first as this is the hardest for his so do it before he gets fatigued. Lt ankle ROM and strength as tolerated.     Debbe Odea, PT,DPT 08/10/2022, 9:47 AM

## 2022-08-17 ENCOUNTER — Ambulatory Visit: Payer: 59 | Admitting: Physical Therapy

## 2022-08-17 ENCOUNTER — Encounter: Payer: Self-pay | Admitting: Physical Therapy

## 2022-08-17 DIAGNOSIS — M25572 Pain in left ankle and joints of left foot: Secondary | ICD-10-CM | POA: Diagnosis not present

## 2022-08-17 DIAGNOSIS — R600 Localized edema: Secondary | ICD-10-CM

## 2022-08-17 DIAGNOSIS — R2689 Other abnormalities of gait and mobility: Secondary | ICD-10-CM | POA: Diagnosis not present

## 2022-08-17 DIAGNOSIS — M25675 Stiffness of left foot, not elsewhere classified: Secondary | ICD-10-CM

## 2022-08-17 DIAGNOSIS — M6281 Muscle weakness (generalized): Secondary | ICD-10-CM

## 2022-08-17 NOTE — Therapy (Signed)
OUTPATIENT PHYSICAL THERAPY LOWER EXTREMITY TREATMENT   Patient Name: Collin [name] MRN: [id number] DOB:Apr 13, 1951, 71 y.o., male Today's Date: 08/17/2022  END OF SESSION:  PT End of Session - 08/17/22 0931     Visit Number 8    Number of Visits 16    Date for PT Re-Evaluation 10/11/22    PT Start Time 0930    PT Stop Time 1018    PT Time Calculation (min) 48 min    Activity Tolerance Patient tolerated treatment well    Behavior During Therapy [facility] for tasks assessed/performed              History reviewed. No pertinent past medical history. History reviewed. No pertinent surgical history. 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 ([place]) 12/15/2021   Polyneuropathy due to type 2 diabetes mellitus ([place]) 12/15/2021   Planovalgus deformity of foot, acquired, left 03/30/2021   Hyperglycemia due to type 2 diabetes mellitus ([place]) 02/10/2021   Hyperlipidemia 05/23/2018   Essential hypertension 05/21/2018   Diabetes mellitus ([place]) 05/21/2018    PCP: Sandi Mariscal, MD   REFERRING PROVIDER: Teresita Madura, PA-C  REFERRING DIAG: [phone] (ICD-10-CM) - Primary osteoarthritis, left ankle and foot  THERAPY DIAG:  Pain in left ankle and joints of left foot  Stiffness of left foot, not elsewhere classified  Other abnormalities of gait and mobility  Muscle weakness (generalized)  Localized edema  Rationale for Evaluation and Treatment: Rehabilitation  ONSET DATE: S/P Left ankle arthoplasty 03/16/22  SUBJECTIVE:   SUBJECTIVE STATEMENT: He says no pain upon arrival and that MD was pleased with his progress as his latest follow up. A new foot orthotic was recommended to him he states.  PERTINENT HISTORY: Multiple left ankle surgeries/OA, DM with neuropathy, left planovalgus deformity of foot PAIN:  Are you having pain? Yes: NPRS scale: 2-3/10 Pain location: left  ankle Pain description: ache, sore Aggravating factors: walking, standing to long Relieving factors: rest, ice  PRECAUTIONS: None  WEIGHT BEARING RESTRICTIONS: WBAT  FALLS:  Has patient fallen in last 6 months? Yes. Number of falls 2  LIVING ENVIRONMENT: Stairs: 7 steps  OCCUPATION: retired  PLOF: Independent  PATIENT GOALS: improve left ankle function and pain  NEXT MD VISIT:   OBJECTIVE:   DIAGNOSTIC FINDINGS:  06/30/22: Radiographs of the left ankle demonstrate TAA hardware in appropriate position. No evidence of fracture or hardware loosening. Arthrodesis hardware without evidence of failure, no persistent lucency. 1 broken screw noted medially which is chronic in nature  PATIENT SURVEYS:  FOTO Eval: 44% functional  COGNITION: Overall cognitive status: Within functional limits for tasks assessed     SENSATION: Light touch decreased some due to neuropathy  EDEMA:  Moderate edema in left anle  MUSCLE LENGTH: Heelcords tight on left ankle  POSTURE:   PALPATION:   LOWER EXTREMITY ROM:  Active ROM/PROM Right eval Left eval Left 08/10/22  Hip flexion     Hip extension     Hip abduction     Hip adduction     Hip internal rotation     Hip external rotation     Knee flexion     Knee extension     Ankle dorsiflexion  2/5 6/  Ankle plantarflexion  20/35 25  Ankle inversion  5/10 10  Ankle eversion  5/10 15   (Blank rows = not tested)  LOWER EXTREMITY MMT:  MMT Left eval  Right 08/08/22  Hip flexion    Hip extension    Hip abduction    Hip adduction    Hip internal rotation    Hip external rotation    Knee flexion    Knee extension    Ankle dorsiflexion 4- 4+  Ankle plantarflexion 4- 4  Ankle inversion 4- 5  Ankle eversion 4- 5   (Blank rows = not tested)  LOWER EXTREMITY SPECIAL TESTS:    FUNCTIONAL TESTS:    GAIT: Eval Comments: ambulates in CAM boot with SPC for community distance, he can walk 10 feet without boot today or  cane  08/17/22 Now is community Ambulator independently without device but does have some difficulty with stairs or prolonged walking   TODAY'S TREATMENT:  08/17/22 -Recumbent bike L3  X 10 min -Gastroc stretch and soleus stretch 3 X 30 sec each on slantboard -Toe walking 3 round trips at counter top  -Tandem walk 3 round trips at counter top -heel walking 3 round trips at counter top -Eccentric heel raises (up with both down with left only) 2X10 -Step down in front leading with Rt, 6 inch step 2 X 10 -Left ankle DF lunge stretch with foot on treadmill 5 sec X15 -Leg press DL 125# 2X15, single leg 75 # 2X10 bilat,  left leg calf press 3X10 25#   -Vasopnuematic device X 10 min, high compression, 34 deg to Lt ankle    PATIENT EDUCATION: Education details: HEP, PT plan of care Person educated: Patient Education method: Explanation, Demonstration, Verbal cues, and Handouts Education comprehension: verbalized understanding and needs further education   HOME EXERCISE PROGRAM: Access Code: [email or web address] URL: [email or web address] Date: 07/19/2022 Prepared by: Elsie Ra  Exercises - Standing Gastroc Stretch at Counter  - 2-3 x daily - 6 x weekly - 1 sets - 3 reps - 30 hold - Standing Soleus Stretch at Counter  - 2-3 x daily - 6 x weekly - 1 sets - 3 reps - 30 hold - Side to Side Weight Shift with Counter Support  - 2-3 x daily - 6 x weekly - 1 sets - 15-20 reps - Staggered Stance Forward Backward Weight Shift with Counter Support  - 2-3 x daily - 6 x weekly - 1 sets - 15-20 reps - Heel Raises with Counter Support  - 2 x daily - 6 x weekly - 2-3 sets - 10 reps - A  ASSESSMENT:  CLINICAL IMPRESSION: He is doing well with his PT but does still lack some plantarflexion strength and fatigues with this so PT recommending to continue PT to maximize this along with his ROM.   OBJECTIVE IMPAIRMENTS: decreased activity tolerance, difficulty walking, decreased balance,  decreased endurance, decreased mobility, decreased ROM, decreased strength, impaired flexibility, impaired LE use, and pain.  ACTIVITY LIMITATIONS: bending, lifting, carry, locomotion, cleaning, community activity, driving, and or occupation  PERSONAL FACTORS:Multiple left ankle surgeries/OA, DM with neuropathy, left planovalgus deformity of foot  are also affecting patient's functional outcome.  REHAB POTENTIAL: Good  CLINICAL DECISION MAKING: Stable/uncomplicated  EVALUATION COMPLEXITY: Low    GOALS: Short term PT Goals Target date: 08/16/2022   Pt will be I and compliant with HEP. Baseline:  Goal status: MET Pt will decrease pain by 25% overall Baseline: Goal status: MET  Long term PT goals Target date:10/11/2022   Pt will improve ROM to [facility] to improve functional mobility Baseline: Goal status: ongoing Pt will improve Lt ankle strength to at least 5/5 MMT in supine to improve functional  strength Baseline: Goal status: ongoing Pt will improve FOTO to at least 48% functional to show improved function Baseline: Goal status: ongoing Pt will reduce pain to overall less than 3/10 with usual activity  Baseline: Goal status: ongoing Pt will be able to ambulate community distances at least 1000 ft WNL gait pattern without complaints and ambulate one flight of stairs Baseline: Goal status: ongoing  PLAN: PT FREQUENCY: 1-2 times per week   PT DURATION: 8-12 weeks  PLANNED INTERVENTIONS (unless contraindicated): aquatic PT, Canalith repositioning, cryotherapy, Electrical stimulation, Iontophoresis with 4 mg/ml dexamethasome, Moist heat, traction, Ultrasound, gait training, Therapeutic exercise, balance training, neuromuscular re-education, patient/family education, prosthetic training, manual techniques, passive ROM, dry needling, taping, vasopnuematic device, vestibular, spinal manipulations, joint manipulations  PLAN FOR NEXT SESSION: Toe walking first as this is the  hardest for his so do it before he gets fatigued. Lt ankle ROM and strength as tolerated.     Debbe Odea, PT,DPT 08/17/2022, 9:31 AM

## 2022-08-23 ENCOUNTER — Encounter: Payer: Self-pay | Admitting: Physical Therapy

## 2022-08-23 ENCOUNTER — Ambulatory Visit: Payer: 59 | Admitting: Physical Therapy

## 2022-08-23 DIAGNOSIS — R2689 Other abnormalities of gait and mobility: Secondary | ICD-10-CM

## 2022-08-23 DIAGNOSIS — M25675 Stiffness of left foot, not elsewhere classified: Secondary | ICD-10-CM

## 2022-08-23 DIAGNOSIS — M25572 Pain in left ankle and joints of left foot: Secondary | ICD-10-CM

## 2022-08-23 DIAGNOSIS — R600 Localized edema: Secondary | ICD-10-CM

## 2022-08-23 DIAGNOSIS — M6281 Muscle weakness (generalized): Secondary | ICD-10-CM

## 2022-08-23 NOTE — Therapy (Signed)
OUTPATIENT PHYSICAL THERAPY LOWER EXTREMITY TREATMENT   Patient Name: Collin [name] MRN: [id number] DOB:Jan 13, 1951, 72 y.o., male Today's Date: 08/23/2022  END OF SESSION:  PT End of Session - 08/23/22 0940     Visit Number 9    Number of Visits 16    Date for PT Re-Evaluation 10/11/22    PT Start Time 0934    PT Stop Time 1022    PT Time Calculation (min) 48 min    Behavior During Therapy [facility] for tasks assessed/performed              History reviewed. No pertinent past medical history. History reviewed. No pertinent surgical history. 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 ([place]) 12/15/2021   Polyneuropathy due to type 2 diabetes mellitus ([place]) 12/15/2021   Planovalgus deformity of foot, acquired, left 03/30/2021   Hyperglycemia due to type 2 diabetes mellitus ([place]) 02/10/2021   Hyperlipidemia 05/23/2018   Essential hypertension 05/21/2018   Diabetes mellitus ([place]) 05/21/2018    PCP: Sandi Mariscal, MD   REFERRING PROVIDER: Teresita Madura, PA-C  REFERRING DIAG: [phone] (ICD-10-CM) - Primary osteoarthritis, left ankle and foot  THERAPY DIAG:  Pain in left ankle and joints of left foot  Stiffness of left foot, not elsewhere classified  Other abnormalities of gait and mobility  Muscle weakness (generalized)  Localized edema  Rationale for Evaluation and Treatment: Rehabilitation  ONSET DATE: S/P Left ankle arthoplasty 03/16/22  SUBJECTIVE:   SUBJECTIVE STATEMENT: He relays about 3/10 pain from walking and lot and walking stairs  PERTINENT HISTORY: Multiple left ankle surgeries/OA, DM with neuropathy, left planovalgus deformity of foot PAIN:  Are you having pain? Yes: NPRS scale: 3/10 Pain location: left ankle Pain description: ache, sore Aggravating factors: walking, standing to long Relieving factors: rest, ice  PRECAUTIONS: None  WEIGHT BEARING  RESTRICTIONS: WBAT  FALLS:  Has patient fallen in last 6 months? Yes. Number of falls 2  LIVING ENVIRONMENT: Stairs: 7 steps  OCCUPATION: retired  PLOF: Independent  PATIENT GOALS: improve left ankle function and pain  NEXT MD VISIT:   OBJECTIVE:   DIAGNOSTIC FINDINGS:  06/30/22: Radiographs of the left ankle demonstrate TAA hardware in appropriate position. No evidence of fracture or hardware loosening. Arthrodesis hardware without evidence of failure, no persistent lucency. 1 broken screw noted medially which is chronic in nature  PATIENT SURVEYS:  FOTO Eval: 44% functional  COGNITION: Overall cognitive status: Within functional limits for tasks assessed     SENSATION: Light touch decreased some due to neuropathy  EDEMA:  Moderate edema in left anle  MUSCLE LENGTH: Heelcords tight on left ankle  POSTURE:   PALPATION:   LOWER EXTREMITY ROM:  Active ROM/PROM Right eval Left eval Left 08/10/22  Hip flexion     Hip extension     Hip abduction     Hip adduction     Hip internal rotation     Hip external rotation     Knee flexion     Knee extension     Ankle dorsiflexion  2/5 6/  Ankle plantarflexion  20/35 25  Ankle inversion  5/10 10  Ankle eversion  5/10 15   (Blank rows = not tested)  LOWER EXTREMITY MMT:  MMT Left eval Right 08/08/22  Hip flexion    Hip extension    Hip abduction    Hip adduction    Hip internal rotation  Hip external rotation    Knee flexion    Knee extension    Ankle dorsiflexion 4- 4+  Ankle plantarflexion 4- 4  Ankle inversion 4- 5  Ankle eversion 4- 5   (Blank rows = not tested)  LOWER EXTREMITY SPECIAL TESTS:    FUNCTIONAL TESTS:    GAIT: Eval Comments: ambulates in CAM boot with SPC for community distance, he can walk 10 feet without boot today or cane  08/17/22 Now is community Ambulator independently without device but does have some difficulty with stairs or prolonged walking   TODAY'S  TREATMENT:  08/23/22 -Recumbent bike L4  X 10 min -Gastroc stretch and soleus stretch 3 X 30 sec each on slantboard -Toe walking 3 round trips at counter top  -Tandem walk 3 round trips at counter top -heel walking 3 round trips at counter top -Eccentric heel raises (up with both down with left only) 2X10 -Step down in front leading with Rt, 6 inch step 2 X 10 -Left ankle DF lunge stretch with foot on treadmill 5 sec X15 -Leg press DL 125# 2X15, single leg 75 # 2X10 bilat,  left leg calf press 3X10 25#   -Vasopnuematic device X 10 min, high compression, 34 deg to Lt ankle    PATIENT EDUCATION: Education details: HEP, PT plan of care Person educated: Patient Education method: Explanation, Demonstration, Verbal cues, and Handouts Education comprehension: verbalized understanding and needs further education   HOME EXERCISE PROGRAM: Access Code: [email or web address] URL: [email or web address] Date: 07/19/2022 Prepared by: Elsie Ra  Exercises - Standing Gastroc Stretch at Counter  - 2-3 x daily - 6 x weekly - 1 sets - 3 reps - 30 hold - Standing Soleus Stretch at Counter  - 2-3 x daily - 6 x weekly - 1 sets - 3 reps - 30 hold - Side to Side Weight Shift with Counter Support  - 2-3 x daily - 6 x weekly - 1 sets - 15-20 reps - Staggered Stance Forward Backward Weight Shift with Counter Support  - 2-3 x daily - 6 x weekly - 1 sets - 15-20 reps - Heel Raises with Counter Support  - 2 x daily - 6 x weekly - 2-3 sets - 10 reps - A  ASSESSMENT:  CLINICAL IMPRESSION: Continued to focus on left ankle PF strength and DF ROM needed to improve ambulation and stairs. PT recommending to continue current plan  OBJECTIVE IMPAIRMENTS: decreased activity tolerance, difficulty walking, decreased balance, decreased endurance, decreased mobility, decreased ROM, decreased strength, impaired flexibility, impaired LE use, and pain.  ACTIVITY LIMITATIONS: bending, lifting, carry, locomotion,  cleaning, community activity, driving, and or occupation  PERSONAL FACTORS:Multiple left ankle surgeries/OA, DM with neuropathy, left planovalgus deformity of foot  are also affecting patient's functional outcome.  REHAB POTENTIAL: Good  CLINICAL DECISION MAKING: Stable/uncomplicated  EVALUATION COMPLEXITY: Low    GOALS: Short term PT Goals Target date: 08/16/2022   Pt will be I and compliant with HEP. Baseline:  Goal status: MET Pt will decrease pain by 25% overall Baseline: Goal status: MET  Long term PT goals Target date:10/11/2022   Pt will improve ROM to [facility] to improve functional mobility Baseline: Goal status: ongoing Pt will improve Lt ankle strength to at least 5/5 MMT in supine to improve functional strength Baseline: Goal status: ongoing Pt will improve FOTO to at least 48% functional to show improved function Baseline: Goal status: ongoing Pt will reduce pain to overall less than 3/10 with usual activity  Baseline: Goal status:  ongoing Pt will be able to ambulate community distances at least 1000 ft WNL gait pattern without complaints and ambulate one flight of stairs Baseline: Goal status: ongoing  PLAN: PT FREQUENCY: 1-2 times per week   PT DURATION: 8-12 weeks  PLANNED INTERVENTIONS (unless contraindicated): aquatic PT, Canalith repositioning, cryotherapy, Electrical stimulation, Iontophoresis with 4 mg/ml dexamethasome, Moist heat, traction, Ultrasound, gait training, Therapeutic exercise, balance training, neuromuscular re-education, patient/family education, prosthetic training, manual techniques, passive ROM, dry needling, taping, vasopnuematic device, vestibular, spinal manipulations, joint manipulations  PLAN FOR NEXT SESSION: Toe walking first as this is the hardest for his so do it before he gets fatigued. Lt ankle ROM and strength as tolerated.     Debbe Odea, PT,DPT 08/23/2022, 9:41 AM

## 2022-08-25 ENCOUNTER — Ambulatory Visit: Payer: 59 | Admitting: Physical Therapy

## 2022-08-25 DIAGNOSIS — M25675 Stiffness of left foot, not elsewhere classified: Secondary | ICD-10-CM | POA: Diagnosis not present

## 2022-08-25 DIAGNOSIS — M6281 Muscle weakness (generalized): Secondary | ICD-10-CM

## 2022-08-25 DIAGNOSIS — M25572 Pain in left ankle and joints of left foot: Secondary | ICD-10-CM | POA: Diagnosis not present

## 2022-08-25 DIAGNOSIS — R600 Localized edema: Secondary | ICD-10-CM

## 2022-08-25 DIAGNOSIS — R2689 Other abnormalities of gait and mobility: Secondary | ICD-10-CM

## 2022-08-25 NOTE — Therapy (Signed)
OUTPATIENT PHYSICAL THERAPY LOWER EXTREMITY TREATMENT Progress Note reporting period 07/19/22 to 08/25/22  See below for objective and subjective measurements relating to patients progress with PT.    Patient Name: Collin [name] MRN: [id number] DOB:[birth year], 72 y.o., male Today's Date: 08/25/2022  END OF SESSION:  PT End of Session - 08/25/22 0931     Visit Number 10    Number of Visits 16    Date for PT Re-Evaluation 10/11/22    PT Start Time 0930    PT Stop Time 1018    PT Time Calculation (min) 48 min    Activity Tolerance Patient tolerated treatment well    Behavior During Therapy [facility] for tasks assessed/performed              No past medical history on file. No past surgical history on file. 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 ([place]) 12/15/2021   Polyneuropathy due to type 2 diabetes mellitus ([place]) 12/15/2021   Planovalgus deformity of foot, acquired, left 03/30/2021   Hyperglycemia due to type 2 diabetes mellitus ([place]) 02/10/2021   Hyperlipidemia 05/23/2018   Essential hypertension 05/21/2018   Diabetes mellitus ([place]) 05/21/2018    PCP: Sandi Mariscal, MD   REFERRING PROVIDER: Teresita Madura, PA-C  REFERRING DIAG: [phone] (ICD-10-CM) - Primary osteoarthritis, left ankle and foot  THERAPY DIAG:  Pain in left ankle and joints of left foot  Stiffness of left foot, not elsewhere classified  Other abnormalities of gait and mobility  Muscle weakness (generalized)  Localized edema  Rationale for Evaluation and Treatment: Rehabilitation  ONSET DATE: S/P Left ankle arthoplasty 03/16/22  SUBJECTIVE:   SUBJECTIVE STATEMENT: He relays low energy today, had gastro follow up. Had some swelling in his foot but pain is not as bad today.  PERTINENT HISTORY: Multiple left ankle surgeries/OA, DM with neuropathy, left planovalgus deformity of foot PAIN:  Are  you having pain? Yes: NPRS scale: 2/10 Pain location: left ankle Pain description: ache, sore Aggravating factors: walking, standing to long Relieving factors: rest, ice  PRECAUTIONS: None  WEIGHT BEARING RESTRICTIONS: WBAT  FALLS:  Has patient fallen in last 6 months? Yes. Number of falls 2  LIVING ENVIRONMENT: Stairs: 7 steps  OCCUPATION: retired  PLOF: Independent  PATIENT GOALS: improve left ankle function and pain  NEXT MD VISIT:   OBJECTIVE:   DIAGNOSTIC FINDINGS:  06/30/22: Radiographs of the left ankle demonstrate TAA hardware in appropriate position. No evidence of fracture or hardware loosening. Arthrodesis hardware without evidence of failure, no persistent lucency. 1 broken screw noted medially which is chronic in nature  PATIENT SURVEYS:  FOTO Eval: 44% functional  COGNITION: Overall cognitive status: Within functional limits for tasks assessed     SENSATION: Light touch decreased some due to neuropathy  EDEMA:  Moderate edema in left anle  MUSCLE LENGTH: Heelcords tight on left ankle  POSTURE:   PALPATION:   LOWER EXTREMITY ROM:  Active ROM/PROM Right eval Left eval Left 08/10/22 08/25/21  Hip flexion      Hip extension      Hip abduction      Hip adduction      Hip internal rotation      Hip external rotation      Knee flexion      Knee extension      Ankle dorsiflexion  2/5 6/ 7/  Ankle plantarflexion  20/35 25 25/  Ankle  inversion  5/10 10 15/  Ankle eversion  5/10 15 10/   (Blank rows = not tested)  LOWER EXTREMITY MMT:  MMT Left eval Right 08/08/22 Right 08/25/21  Hip flexion     Hip extension     Hip abduction     Hip adduction     Hip internal rotation     Hip external rotation     Knee flexion     Knee extension     Ankle dorsiflexion 4- 4+ 5  Ankle plantarflexion 4- 4 4+ in supine 3- in standing  Ankle inversion 4- 5 5  Ankle eversion 4- 5 5   (Blank rows = not tested)  LOWER EXTREMITY SPECIAL TESTS:     FUNCTIONAL TESTS:    GAIT: Eval Comments: ambulates in CAM boot with SPC for community distance, he can walk 10 feet without boot today or cane  08/17/22 Now is community Ambulator independently without device but does have some difficulty with stairs or prolonged walking   TODAY'S TREATMENT:  08/25/22 -Recumbent bike L4  X 10 min -Gastroc stretch and soleus stretch 3 X 30 sec each on slantboard -Toe walking 3 round trips at counter top  -Tandem walk 3 round trips at counter top -heel walking 3 round trips at counter top -Eccentric heel raises (up with both down with left only) 2X10 -Step down in front leading with Rt, 6 inch step 2 X 10 -Rocker board 1 min A-P, 1 min lateral -Left ankle DF lunge stretch with foot on treadmill 5 sec X15 -Leg press DL 131# 2X12, single leg 75 # 2X10 bilat,  left leg calf press 3X10 25#   -Vasopnuematic device X 10 min, high compression, 34 deg to Lt ankle    PATIENT EDUCATION: Education details: HEP, PT plan of care Person educated: Patient Education method: Explanation, Demonstration, Verbal cues, and Handouts Education comprehension: verbalized understanding and needs further education   HOME EXERCISE PROGRAM: Access Code: [email or web address] URL: [email or web address] Date: 07/19/2022 Prepared by: Elsie Ra  Exercises - Standing Gastroc Stretch at Counter  - 2-3 x daily - 6 x weekly - 1 sets - 3 reps - 30 hold - Standing Soleus Stretch at Counter  - 2-3 x daily - 6 x weekly - 1 sets - 3 reps - 30 hold - Side to Side Weight Shift with Counter Support  - 2-3 x daily - 6 x weekly - 1 sets - 15-20 reps - Staggered Stance Forward Backward Weight Shift with Counter Support  - 2-3 x daily - 6 x weekly - 1 sets - 15-20 reps - Heel Raises with Counter Support  - 2 x daily - 6 x weekly - 2-3 sets - 10 reps - A  ASSESSMENT:  CLINICAL IMPRESSION: See updated measurements for progress made in Lt ankle ROM and strength post op. He  still has PF weakness and will continue to benefit from and current PT plan  OBJECTIVE IMPAIRMENTS: decreased activity tolerance, difficulty walking, decreased balance, decreased endurance, decreased mobility, decreased ROM, decreased strength, impaired flexibility, impaired LE use, and pain.  ACTIVITY LIMITATIONS: bending, lifting, carry, locomotion, cleaning, community activity, driving, and or occupation  PERSONAL FACTORS:Multiple left ankle surgeries/OA, DM with neuropathy, left planovalgus deformity of foot  are also affecting patient's functional outcome.  REHAB POTENTIAL: Good  CLINICAL DECISION MAKING: Stable/uncomplicated  EVALUATION COMPLEXITY: Low    GOALS: Short term PT Goals Target date: 08/16/2022   Pt will be I and compliant with HEP. Baseline:  Goal status:  MET Pt will decrease pain by 25% overall Baseline: Goal status: MET  Long term PT goals Target date:10/11/2022   Pt will improve ROM to [facility] to improve functional mobility Baseline: Goal status: ongoing Pt will improve Lt ankle strength to at least 5/5 MMT in supine to improve functional strength Baseline: Goal status: ongoing Pt will improve FOTO to at least 48% functional to show improved function Baseline: Goal status: ongoing Pt will reduce pain to overall less than 3/10 with usual activity  Baseline: Goal status: ongoing Pt will be able to ambulate community distances at least 1000 ft WNL gait pattern without complaints and ambulate one flight of stairs Baseline: Goal status: ongoing  PLAN: PT FREQUENCY: 1-2 times per week   PT DURATION: 8-12 weeks  PLANNED INTERVENTIONS (unless contraindicated): aquatic PT, Canalith repositioning, cryotherapy, Electrical stimulation, Iontophoresis with 4 mg/ml dexamethasome, Moist heat, traction, Ultrasound, gait training, Therapeutic exercise, balance training, neuromuscular re-education, patient/family education, prosthetic training, manual techniques,  passive ROM, dry needling, taping, vasopnuematic device, vestibular, spinal manipulations, joint manipulations  PLAN FOR NEXT SESSION: Toe walking first as this is the hardest for his so do it before he gets fatigued. PF strength emphassis and  Lt ankle ROM    Debbe Odea, PT,DPT 08/25/2022, 9:31 AM

## 2022-08-29 ENCOUNTER — Encounter: Payer: Self-pay | Admitting: Rehabilitative and Restorative Service Providers"

## 2022-08-29 ENCOUNTER — Ambulatory Visit: Payer: 59 | Admitting: Rehabilitative and Restorative Service Providers"

## 2022-08-29 DIAGNOSIS — R2689 Other abnormalities of gait and mobility: Secondary | ICD-10-CM | POA: Diagnosis not present

## 2022-08-29 DIAGNOSIS — M25675 Stiffness of left foot, not elsewhere classified: Secondary | ICD-10-CM | POA: Diagnosis not present

## 2022-08-29 DIAGNOSIS — R600 Localized edema: Secondary | ICD-10-CM

## 2022-08-29 DIAGNOSIS — M6281 Muscle weakness (generalized): Secondary | ICD-10-CM

## 2022-08-29 DIAGNOSIS — M25572 Pain in left ankle and joints of left foot: Secondary | ICD-10-CM | POA: Diagnosis not present

## 2022-08-29 NOTE — Therapy (Signed)
OUTPATIENT PHYSICAL THERAPYTREATMENT   Patient Name: Collin [name] MRN: [id number] DOB:07-20-1951, 72 y.o., male Today's Date: 08/29/2022  END OF SESSION:  PT End of Session - 08/29/22 1006     Visit Number 11    Number of Visits 16    Date for PT Re-Evaluation 10/11/22    PT Start Time 0930    PT Stop Time 1020    PT Time Calculation (min) 50 min    Activity Tolerance Patient tolerated treatment well    Behavior During Therapy [facility] for tasks assessed/performed               History reviewed. No pertinent past medical history. History reviewed. No pertinent surgical history. 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) 12/15/2021   Polyneuropathy due to type 2 diabetes mellitus (HCC) 12/15/2021   Planovalgus deformity of foot, acquired, left 03/30/2021   Hyperglycemia due to type 2 diabetes mellitus (HCC) 02/10/2021   Hyperlipidemia 05/23/2018   Essential hypertension 05/21/2018   Diabetes mellitus (HCC) 05/21/2018    PCP: Salli Real, MD   REFERRING PROVIDER: Elige Radon, PA-C  REFERRING DIAG: [phone] (ICD-10-CM) - Primary osteoarthritis, left ankle and foot  THERAPY DIAG:  Pain in left ankle and joints of left foot  Stiffness of left foot, not elsewhere classified  Other abnormalities of gait and mobility  Muscle weakness (generalized)  Localized edema  Rationale for Evaluation and Treatment: Rehabilitation  ONSET DATE: S/P Left ankle arthoplasty 03/16/22  SUBJECTIVE:   SUBJECTIVE STATEMENT: He indicated frustration with strength for PF not coming back.  He also mentioned tightness in DF when going down stairs.   PERTINENT HISTORY: Multiple left ankle surgeries/OA, DM with neuropathy, left planovalgus deformity of foot PAIN:  NPRS scale: 0/10 Pain location: left ankle Pain description: ache, sore Aggravating factors: walking, standing to  long Relieving factors: rest, ice  PRECAUTIONS: None  WEIGHT BEARING RESTRICTIONS: WBAT  FALLS:  Has patient fallen in last 6 months? Yes. Number of falls 2  LIVING ENVIRONMENT: Stairs: 7 steps  OCCUPATION: retired  PLOF: Independent  PATIENT GOALS: improve left ankle function and pain  NEXT MD VISIT:   OBJECTIVE:   DIAGNOSTIC FINDINGS:  06/30/22: Radiographs of the left ankle demonstrate TAA hardware in appropriate position. No evidence of fracture or hardware loosening. Arthrodesis hardware without evidence of failure, no persistent lucency. 1 broken screw noted medially which is chronic in nature  PATIENT SURVEYS:  FOTO Eval: 44% functional  COGNITION: Overall cognitive status: Within functional limits for tasks assessed     SENSATION: Light touch decreased some due to neuropathy  EDEMA:  Moderate edema in left anle  MUSCLE LENGTH: Heelcords tight on left ankle  POSTURE:   PALPATION:   LOWER EXTREMITY ROM:  Active ROM/PROM Right eval Left eval Left 08/10/22 08/25/21  Hip flexion      Hip extension      Hip abduction      Hip adduction      Hip internal rotation      Hip external rotation      Knee flexion      Knee extension      Ankle dorsiflexion  2/5 6/ 7/  Ankle plantarflexion  20/35 25 25/  Ankle inversion  5/10 10 15/  Ankle eversion  5/10 15 10/   (Blank rows = not tested)  LOWER EXTREMITY MMT:  MMT Left eval Right  08/08/22 Right 08/25/21  Hip flexion     Hip extension     Hip abduction     Hip adduction     Hip internal rotation     Hip external rotation     Knee flexion     Knee extension     Ankle dorsiflexion 4- 4+ 5  Ankle plantarflexion 4- 4 4+ in supine 3- in standing  Ankle inversion 4- 5 5  Ankle eversion 4- 5 5   (Blank rows = not tested)  LOWER EXTREMITY SPECIAL TESTS:    FUNCTIONAL TESTS:    GAIT: Eval Comments: ambulates in CAM boot with SPC for community distance, he can walk 10 feet without boot today  or cane  08/17/22 Now is community Ambulator independently without device but does have some difficulty with stairs or prolonged walking   TODAY'S TREATMENT:  08/29/2022: Recumbent bike Lvl 5 x 10 mins Tandem ambulation in // bars occasional hand held assist fwd/back 10 ft x 5 each way Toe walking in // bars fwd 10 ft x 6 c constant hand held assist Heel walking in // bars fwd 10 ft x 6 moderate hand held assist Eccentric heel raise with bilateral hand assist on bar 2 x 10  Leg press single leg 75 # 2X10 bilat,  left leg calf press 3X10 25#  Manual DF stretch on step c mobilization c movement posterior talar glide in movement x 10  Vasopnuematic device X 10 min, high compression, 34 deg to Lt ankle   08/25/22 -Recumbent bike L4  X 10 min -Gastroc stretch and soleus stretch 3 X 30 sec each on slantboard -Toe walking 3 round trips at counter top  -Tandem walk 3 round trips at counter top -heel walking 3 round trips at counter top -Eccentric heel raises (up with both down with left only) 2X10 -Step down in front leading with Rt, 6 inch step 2 X 10 -Rocker board 1 min A-P, 1 min lateral -Left ankle DF lunge stretch with foot on treadmill 5 sec X15 -Leg press DL 131# 2X12, single leg 75 # 2X10 bilat,  left leg calf press 3X10 25#   -Vasopnuematic device X 10 min, high compression, 34 deg to Lt ankle    PATIENT EDUCATION: Education details: HEP, PT plan of care Person educated: Patient Education method: Explanation, Demonstration, Verbal cues, and Handouts Education comprehension: verbalized understanding and needs further education   HOME EXERCISE PROGRAM: Access Code: [email or web address] URL: [email or web address] Date: 07/19/2022 Prepared by: Elsie Ra  Exercises - Standing Gastroc Stretch at Counter  - 2-3 x daily - 6 x weekly - 1 sets - 3 reps - 30 hold - Standing Soleus Stretch at Counter  - 2-3 x daily - 6 x weekly - 1 sets - 3 reps - 30 hold - Side to Side  Weight Shift with Counter Support  - 2-3 x daily - 6 x weekly - 1 sets - 15-20 reps - Staggered Stance Forward Backward Weight Shift with Counter Support  - 2-3 x daily - 6 x weekly - 1 sets - 15-20 reps - Heel Raises with Counter Support  - 2 x daily - 6 x weekly - 2-3 sets - 10 reps - A  ASSESSMENT:  CLINICAL IMPRESSION: PF weakness on Lt leg evident in ambulation and difficulty persists with controlling WB PF.  Detailed DF stretch on step again for DF mobility gains in quality (quantity looked pretty comparable to Rt today).   OBJECTIVE IMPAIRMENTS: decreased activity tolerance,  difficulty walking, decreased balance, decreased endurance, decreased mobility, decreased ROM, decreased strength, impaired flexibility, impaired LE use, and pain.  ACTIVITY LIMITATIONS: bending, lifting, carry, locomotion, cleaning, community activity, driving, and or occupation  PERSONAL FACTORS:Multiple left ankle surgeries/OA, DM with neuropathy, left planovalgus deformity of foot  are also affecting patient's functional outcome.  REHAB POTENTIAL: Good  CLINICAL DECISION MAKING: Stable/uncomplicated  EVALUATION COMPLEXITY: Low    GOALS: Short term PT Goals Target date: 08/16/2022   Pt will be I and compliant with HEP. Baseline:  Goal status: MET Pt will decrease pain by 25% overall Baseline: Goal status: MET  Long term PT goals Target date:10/11/2022   Pt will improve ROM to [facility] to improve functional mobility Baseline: Goal status: ongoing Pt will improve Lt ankle strength to at least 5/5 MMT in supine to improve functional strength Baseline: Goal status: ongoing Pt will improve FOTO to at least 48% functional to show improved function Baseline: Goal status: ongoing Pt will reduce pain to overall less than 3/10 with usual activity  Baseline: Goal status: ongoing Pt will be able to ambulate community distances at least 1000 ft WNL gait pattern without complaints and ambulate one flight of  stairs Baseline: Goal status: ongoing  PLAN: PT FREQUENCY: 1-2 times per week   PT DURATION: 8-12 weeks  PLANNED INTERVENTIONS (unless contraindicated): aquatic PT, Canalith repositioning, cryotherapy, Electrical stimulation, Iontophoresis with 4 mg/ml dexamethasome, Moist heat, traction, Ultrasound, gait training, Therapeutic exercise, balance training, neuromuscular re-education, patient/family education, prosthetic training, manual techniques, passive ROM, dry needling, taping, vasopnuematic device, vestibular, spinal manipulations, joint manipulations  PLAN FOR NEXT SESSION: Toe walking first as this is the hardest for his so do it before he gets fatigued continued. PF strength  c DF recheck  Chyrel Masson, PT, DPT, OCS, ATC 08/29/22  10:11 AM

## 2022-09-01 ENCOUNTER — Encounter: Payer: Self-pay | Admitting: Physical Therapy

## 2022-09-01 ENCOUNTER — Ambulatory Visit: Payer: 59 | Admitting: Physical Therapy

## 2022-09-01 DIAGNOSIS — M25675 Stiffness of left foot, not elsewhere classified: Secondary | ICD-10-CM | POA: Diagnosis not present

## 2022-09-01 DIAGNOSIS — M25572 Pain in left ankle and joints of left foot: Secondary | ICD-10-CM | POA: Diagnosis not present

## 2022-09-01 DIAGNOSIS — R2689 Other abnormalities of gait and mobility: Secondary | ICD-10-CM

## 2022-09-01 DIAGNOSIS — R600 Localized edema: Secondary | ICD-10-CM

## 2022-09-01 DIAGNOSIS — M6281 Muscle weakness (generalized): Secondary | ICD-10-CM | POA: Diagnosis not present

## 2022-09-01 NOTE — Therapy (Signed)
OUTPATIENT PHYSICAL [place]   Patient Name: Collin [name] MRN: [id number] DOB:22-Nov-1950, 72 y.o., male Today's Date: 09/01/2022  END OF SESSION:  PT End of Session - 09/01/22 0929     Visit Number 12    Number of Visits 16    Date for PT Re-Evaluation 10/11/22    PT Start Time 0928    PT Stop Time 1015    PT Time Calculation (min) 47 min    Activity Tolerance Patient tolerated treatment well    Behavior During Therapy [facility] for tasks assessed/performed               History reviewed. No pertinent past medical history. History reviewed. No pertinent surgical history. 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 ([place]) 12/15/2021   Polyneuropathy due to type 2 diabetes mellitus ([place]) 12/15/2021   Planovalgus deformity of foot, acquired, left 03/30/2021   Hyperglycemia due to type 2 diabetes mellitus ([place]) 02/10/2021   Hyperlipidemia 05/23/2018   Essential hypertension 05/21/2018   Diabetes mellitus ([place]) 05/21/2018    PCP: Sandi Mariscal, MD   REFERRING PROVIDER: Teresita Madura, PA-C  REFERRING DIAG: [phone] (ICD-10-CM) - Primary osteoarthritis, left ankle and foot  THERAPY DIAG:  Pain in left ankle and joints of left foot  Stiffness of left foot, not elsewhere classified  Other abnormalities of gait and mobility  Muscle weakness (generalized)  Localized edema  Rationale for Evaluation and Treatment: Rehabilitation  ONSET DATE: S/P Left ankle arthoplasty 03/16/22  SUBJECTIVE:   SUBJECTIVE STATEMENT: He relays some discomfort in his arch and at the edge of his foot today  PERTINENT HISTORY: Multiple left ankle surgeries/OA, DM with neuropathy, left planovalgus deformity of foot PAIN:  NPRS scale: 2-3/10 Pain location: left ankle Pain description: ache, sore Aggravating factors: walking, standing to long Relieving factors: rest,  ice  PRECAUTIONS: None  WEIGHT BEARING RESTRICTIONS: WBAT  FALLS:  Has patient fallen in last 6 months? Yes. Number of falls 2  LIVING ENVIRONMENT: Stairs: 7 steps  OCCUPATION: retired  PLOF: Independent  PATIENT GOALS: improve left ankle function and pain  NEXT MD VISIT:   OBJECTIVE:   DIAGNOSTIC FINDINGS:  06/30/22: Radiographs of the left ankle demonstrate TAA hardware in appropriate position. No evidence of fracture or hardware loosening. Arthrodesis hardware without evidence of failure, no persistent lucency. 1 broken screw noted medially which is chronic in nature  PATIENT SURVEYS:  FOTO Eval: 44% functional  COGNITION: Overall cognitive status: Within functional limits for tasks assessed     SENSATION: Light touch decreased some due to neuropathy  EDEMA:  Moderate edema in left anle  MUSCLE LENGTH: Heelcords tight on left ankle  POSTURE:   PALPATION:   LOWER EXTREMITY ROM:  Active ROM/PROM Right eval Left eval Left 08/10/22 08/25/21  Hip flexion      Hip extension      Hip abduction      Hip adduction      Hip internal rotation      Hip external rotation      Knee flexion      Knee extension      Ankle dorsiflexion  2/5 6/ 7/  Ankle plantarflexion  20/35 25 25/  Ankle inversion  5/10 10 15/  Ankle eversion  5/10 15 10/   (Blank rows = not tested)  LOWER EXTREMITY MMT:  MMT Left eval Right 08/08/22 Right 08/25/21  Hip flexion  Hip extension     Hip abduction     Hip adduction     Hip internal rotation     Hip external rotation     Knee flexion     Knee extension     Ankle dorsiflexion 4- 4+ 5  Ankle plantarflexion 4- 4 4+ in supine 3- in standing  Ankle inversion 4- 5 5  Ankle eversion 4- 5 5   (Blank rows = not tested)  LOWER EXTREMITY SPECIAL TESTS:    FUNCTIONAL TESTS:    GAIT: Eval Comments: ambulates in CAM boot with SPC for community distance, he can walk 10 feet without boot today or cane  08/17/22 Now is  community Ambulator independently without device but does have some difficulty with stairs or prolonged walking   TODAY'S TREATMENT:  09/01/22 -Recumbent bike L4  X 10 min -Gastroc stretch and soleus stretch 3 X 30 sec each on slantboard -Toe walking 3 round trips at counter top  -Tandem walk 3 round trips at counter top -heel walking 3 round trips at counter top -Eccentric heel raises (up with both down with left only) 3X10 -Step up in front leading with left and down in front leading with Rt, 6 inch step X20 with UE suppport -Rocker board 1.5 min A-P holding each stretch 3 seconds  -Leg press DL 161# 0R60,  left leg calf press 3X10 25#   -Vasopnuematic device X 10 min, high compression, 34 deg to Lt ankle  08/29/2022: Recumbent bike Lvl 5 x 10 mins seat #11 Tandem ambulation in // bars occasional hand held assist fwd/back 10 ft x 5 each way Toe walking in // bars fwd 10 ft x 6 c constant hand held assist Heel walking in // bars fwd 10 ft x 6 moderate hand held assist Eccentric heel raise with bilateral hand assist on bar 2 x 10  Leg press single leg 75 # 2X10 bilat,  left leg calf press 3X10 25#  Manual DF stretch on step c mobilization c movement posterior talar glide in movement x 10  Vasopnuematic device X 10 min, high compression, 34 deg to Lt ankle       PATIENT EDUCATION: Education details: HEP, PT plan of care Person educated: Patient Education method: Explanation, Demonstration, Verbal cues, and Handouts Education comprehension: verbalized understanding and needs further education   HOME EXERCISE PROGRAM: Access Code: [email or web address] URL: [email or web address] Date: 07/19/2022 Prepared by: Ivery Quale  Exercises - Standing Gastroc Stretch at Counter  - 2-3 x daily - 6 x weekly - 1 sets - 3 reps - 30 hold - Standing Soleus Stretch at Counter  - 2-3 x daily - 6 x weekly - 1 sets - 3 reps - 30 hold - Side to Side Weight Shift with Counter Support  -  2-3 x daily - 6 x weekly - 1 sets - 15-20 reps - Staggered Stance Forward Backward Weight Shift with Counter Support  - 2-3 x daily - 6 x weekly - 1 sets - 15-20 reps - Heel Raises with Counter Support  - 2 x daily - 6 x weekly - 2-3 sets - 10 reps - A  ASSESSMENT:  CLINICAL IMPRESSION:Continued to work to address his deficits in Lt ankle PF weakness and DF stiffness. He shows good effort with PT. He does still limp at times and PT recommending to continue with PT.   OBJECTIVE IMPAIRMENTS: decreased activity tolerance, difficulty walking, decreased balance, decreased endurance, decreased mobility, decreased ROM, decreased strength, impaired flexibility, impaired  LE use, and pain.  ACTIVITY LIMITATIONS: bending, lifting, carry, locomotion, cleaning, community activity, driving, and or occupation  PERSONAL FACTORS:Multiple left ankle surgeries/OA, DM with neuropathy, left planovalgus deformity of foot  are also affecting patient's functional outcome.  REHAB POTENTIAL: Good  CLINICAL DECISION MAKING: Stable/uncomplicated  EVALUATION COMPLEXITY: Low    GOALS: Short term PT Goals Target date: 08/16/2022   Pt will be I and compliant with HEP. Baseline:  Goal status: MET Pt will decrease pain by 25% overall Baseline: Goal status: MET  Long term PT goals Target date:10/11/2022   Pt will improve ROM to [facility] to improve functional mobility Baseline: Goal status: ongoing Pt will improve Lt ankle strength to at least 5/5 MMT in supine to improve functional strength Baseline: Goal status: ongoing Pt will improve FOTO to at least 48% functional to show improved function Baseline: Goal status: ongoing Pt will reduce pain to overall less than 3/10 with usual activity  Baseline: Goal status: ongoing Pt will be able to ambulate community distances at least 1000 ft WNL gait pattern without complaints and ambulate one flight of stairs Baseline: Goal status: ongoing  PLAN: PT FREQUENCY:  1-2 times per week   PT DURATION: 8-12 weeks  PLANNED INTERVENTIONS (unless contraindicated): aquatic PT, Canalith repositioning, cryotherapy, Electrical stimulation, Iontophoresis with 4 mg/ml dexamethasome, Moist heat, traction, Ultrasound, gait training, Therapeutic exercise, balance training, neuromuscular re-education, patient/family education, prosthetic training, manual techniques, passive ROM, dry needling, taping, vasopnuematic device, vestibular, spinal manipulations, joint manipulations  PLAN FOR NEXT SESSION: Toe walking first as this is the hardest for his so do it before he gets fatigued continued. PF strength  c DF recheck  Elsie Ra, PT, DPT 09/01/22 9:30 AM

## 2022-09-05 ENCOUNTER — Encounter: Payer: Self-pay | Admitting: Physical Therapy

## 2022-09-05 ENCOUNTER — Ambulatory Visit: Payer: 59 | Admitting: Physical Therapy

## 2022-09-05 DIAGNOSIS — R600 Localized edema: Secondary | ICD-10-CM | POA: Diagnosis not present

## 2022-09-05 DIAGNOSIS — R2689 Other abnormalities of gait and mobility: Secondary | ICD-10-CM

## 2022-09-05 DIAGNOSIS — M25572 Pain in left ankle and joints of left foot: Secondary | ICD-10-CM | POA: Diagnosis not present

## 2022-09-05 DIAGNOSIS — M25675 Stiffness of left foot, not elsewhere classified: Secondary | ICD-10-CM

## 2022-09-05 DIAGNOSIS — M6281 Muscle weakness (generalized): Secondary | ICD-10-CM | POA: Diagnosis not present

## 2022-09-05 NOTE — Therapy (Signed)
OUTPATIENT PHYSICAL [place]   Patient Name: Collin [name] MRN: [id number] DOB:04/10/1951, 72 y.o., male Today's Date: 09/05/2022  END OF SESSION:  PT End of Session - 09/05/22 0935     Visit Number 13    Number of Visits 16    Date for PT Re-Evaluation 10/11/22    PT Start Time 0932    PT Stop Time 1020    PT Time Calculation (min) 48 min    Activity Tolerance Patient tolerated treatment well    Behavior During Therapy [facility] for tasks assessed/performed               History reviewed. No pertinent past medical history. History reviewed. No pertinent surgical history. 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 ([place]) 12/15/2021   Polyneuropathy due to type 2 diabetes mellitus ([place]) 12/15/2021   Planovalgus deformity of foot, acquired, left 03/30/2021   Hyperglycemia due to type 2 diabetes mellitus ([place]) 02/10/2021   Hyperlipidemia 05/23/2018   Essential hypertension 05/21/2018   Diabetes mellitus ([place]) 05/21/2018    PCP: Sandi Mariscal, MD   REFERRING PROVIDER: Teresita Madura, PA-C  REFERRING DIAG: [phone] (ICD-10-CM) - Primary osteoarthritis, left ankle and foot  THERAPY DIAG:  Pain in left ankle and joints of left foot  Stiffness of left foot, not elsewhere classified  Muscle weakness (generalized)  Localized edema  Other abnormalities of gait and mobility  Rationale for Evaluation and Treatment: Rehabilitation  ONSET DATE: S/P Left ankle arthoplasty 03/16/22  SUBJECTIVE:   SUBJECTIVE STATEMENT: He still reports some discomfort on the side of his foot and arches, but not much pain.   PERTINENT HISTORY: Multiple left ankle surgeries/OA, DM with neuropathy, left planovalgus deformity of foot PAIN:  NPRS scale: 2-3/10 Pain location: left ankle Pain description: ache, sore Aggravating factors: walking, standing to long Relieving factors: rest,  ice  PRECAUTIONS: None  WEIGHT BEARING RESTRICTIONS: No longer any restrictions  FALLS:  Has patient fallen in last 6 months? Yes. Number of falls 2  LIVING ENVIRONMENT: Stairs: 7 steps  OCCUPATION: retired  PLOF: Independent  PATIENT GOALS: improve left ankle function and pain  NEXT MD VISIT:   OBJECTIVE:   DIAGNOSTIC FINDINGS:  06/30/22: Radiographs of the left ankle demonstrate TAA hardware in appropriate position. No evidence of fracture or hardware loosening. Arthrodesis hardware without evidence of failure, no persistent lucency. 1 broken screw noted medially which is chronic in nature  PATIENT SURVEYS:  FOTO Eval: 44% functional  COGNITION: Overall cognitive status: Within functional limits for tasks assessed     SENSATION: Light touch decreased some due to neuropathy  EDEMA:  Moderate edema in left anle  MUSCLE LENGTH: Heelcords tight on left ankle  POSTURE:   PALPATION:   LOWER EXTREMITY ROM:  Active ROM/PROM Right eval Left eval Left 08/10/22 08/25/21  Hip flexion      Hip extension      Hip abduction      Hip adduction      Hip internal rotation      Hip external rotation      Knee flexion      Knee extension      Ankle dorsiflexion  2/5 6/ 7/  Ankle plantarflexion  20/35 25 25/  Ankle inversion  5/10 10 15/  Ankle eversion  5/10 15 10/   (Blank rows = not tested)  LOWER EXTREMITY MMT:  MMT Left eval Right 08/08/22  Right 08/25/21  Hip flexion     Hip extension     Hip abduction     Hip adduction     Hip internal rotation     Hip external rotation     Knee flexion     Knee extension     Ankle dorsiflexion 4- 4+ 5  Ankle plantarflexion 4- 4 4+ in supine 3- in standing  Ankle inversion 4- 5 5  Ankle eversion 4- 5 5   (Blank rows = not tested)  LOWER EXTREMITY SPECIAL TESTS:    FUNCTIONAL TESTS:    GAIT: Eval Comments: ambulates in CAM boot with SPC for community distance, he can walk 10 feet without boot today or  cane  08/17/22 Now is community Ambulator independently without device but does have some difficulty with stairs or prolonged walking   TODAY'S TREATMENT:  09/05/22 -Treadmill 2.0 mph 5% incline x20min -Gastroc stretch and soleus stretch 3 X 30 sec each on slantboard -SLS on left with UE support 10 sec hold x5 -Toe walking 4 round trips at treadmill with UE support  -Heel walking 4 round trips at treadmill with UE support  -Eccentric heel raises (up with both down with left only) 3X10 -Step up in front leading with left and down in front leading with Rt, 6 inch step X20 with UE suppport -Rocker board 1.5 min A-P  -Rocker board 1.5 min lateral   -Leg press DL 956# 2Z30,  left leg calf press 3X10 31#   -Vasopnuematic device X 10 min, high compression, 34 deg to Lt ankle  09/01/22 -Recumbent bike L4  X 10 min -Gastroc stretch and soleus stretch 3 X 30 sec each on slantboard -Toe walking 3 round trips at counter top  -Tandem walk 3 round trips at counter top -heel walking 3 round trips at counter top -Eccentric heel raises (up with both down with left only) 3X10 -Step up in front leading with left and down in front leading with Rt, 6 inch step X20 with UE suppport -Rocker board 1.5 min A-P holding each stretch 3 seconds  -Leg press DL 865# 7Q46,  left leg calf press 3X10 25#   -Vasopnuematic device X 10 min, high compression, 34 deg to Lt ankle  08/29/2022: Recumbent bike Lvl 5 x 10 mins seat #11 Tandem ambulation in // bars occasional hand held assist fwd/back 10 ft x 5 each way Toe walking in // bars fwd 10 ft x 6 c constant hand held assist Heel walking in // bars fwd 10 ft x 6 moderate hand held assist Eccentric heel raise with bilateral hand assist on bar 2 x 10  Leg press single leg 75 # 2X10 bilat,  left leg calf press 3X10 25#  Manual DF stretch on step c mobilization c movement posterior talar glide in movement x 10  Vasopnuematic device X 10 min, high compression, 34  deg to Lt ankle       PATIENT EDUCATION: Education details: HEP, PT plan of care Person educated: Patient Education method: Explanation, Demonstration, Verbal cues, and Handouts Education comprehension: verbalized understanding and needs further education   HOME EXERCISE PROGRAM: Access Code: [email or web address] URL: [email or web address] Date: 07/19/2022 Prepared by: Ivery Quale  Exercises - Standing Gastroc Stretch at Counter  - 2-3 x daily - 6 x weekly - 1 sets - 3 reps - 30 hold - Standing Soleus Stretch at Counter  - 2-3 x daily - 6 x weekly - 1 sets - 3 reps - 30 hold -  Side to Side Weight Shift with Counter Support  - 2-3 x daily - 6 x weekly - 1 sets - 15-20 reps - Staggered Stance Forward Backward Weight Shift with Counter Support  - 2-3 x daily - 6 x weekly - 1 sets - 15-20 reps - Heel Raises with Counter Support  - 2 x daily - 6 x weekly - 2-3 sets - 10 reps - A  ASSESSMENT:  CLINICAL IMPRESSION: We progressed his ankle strength program today and increased the weight on his SL calf raises, had him walk up incline for ankle ROM, and worked on [employer] (he does need UE support with this due to decreased balance from neuropathy). He will continue to benefit from further PT to address his remaining functional deficits in this.   OBJECTIVE IMPAIRMENTS: decreased activity tolerance, difficulty walking, decreased balance, decreased endurance, decreased mobility, decreased ROM, decreased strength, impaired flexibility, impaired LE use, and pain.  ACTIVITY LIMITATIONS: bending, lifting, carry, locomotion, cleaning, community activity, driving, and or occupation  PERSONAL FACTORS:Multiple left ankle surgeries/OA, DM with neuropathy, left planovalgus deformity of foot  are also affecting patient's functional outcome.  REHAB POTENTIAL: Good  CLINICAL DECISION MAKING: Stable/uncomplicated  EVALUATION COMPLEXITY: Low    GOALS: Short term PT Goals Target date:  08/16/2022   Pt will be I and compliant with HEP. Baseline:  Goal status: MET Pt will decrease pain by 25% overall Baseline: Goal status: MET  Long term PT goals Target date:10/11/2022   Pt will improve ROM to [facility] to improve functional mobility Baseline: Goal status: ongoing Pt will improve Lt ankle strength to at least 5/5 MMT in supine to improve functional strength Baseline: Goal status: ongoing Pt will improve FOTO to at least 48% functional to show improved function Baseline: Goal status: ongoing Pt will reduce pain to overall less than 3/10 with usual activity  Baseline: Goal status: ongoing Pt will be able to ambulate community distances at least 1000 ft WNL gait pattern without complaints and ambulate one flight of stairs Baseline: Goal status: ongoing  PLAN: PT FREQUENCY: 1-2 times per week   PT DURATION: 8-12 weeks  PLANNED INTERVENTIONS (unless contraindicated): aquatic PT, Canalith repositioning, cryotherapy, Electrical stimulation, Iontophoresis with 4 mg/ml dexamethasome, Moist heat, traction, Ultrasound, gait training, Therapeutic exercise, balance training, neuromuscular re-education, patient/family education, prosthetic training, manual techniques, passive ROM, dry needling, taping, vasopnuematic device, vestibular, spinal manipulations, joint manipulations  PLAN FOR NEXT SESSION: Toe walking first as this is the hardest for his so do it before he gets fatigued continued.   Elsie Ra, PT, DPT 09/05/22 10:11 AM

## 2022-09-08 ENCOUNTER — Encounter: Payer: Self-pay | Admitting: Physical Therapy

## 2022-09-08 ENCOUNTER — Ambulatory Visit: Payer: 59 | Admitting: Physical Therapy

## 2022-09-08 DIAGNOSIS — R600 Localized edema: Secondary | ICD-10-CM

## 2022-09-08 DIAGNOSIS — M25572 Pain in left ankle and joints of left foot: Secondary | ICD-10-CM | POA: Diagnosis not present

## 2022-09-08 DIAGNOSIS — M6281 Muscle weakness (generalized): Secondary | ICD-10-CM | POA: Diagnosis not present

## 2022-09-08 DIAGNOSIS — R2689 Other abnormalities of gait and mobility: Secondary | ICD-10-CM

## 2022-09-08 DIAGNOSIS — M25675 Stiffness of left foot, not elsewhere classified: Secondary | ICD-10-CM | POA: Diagnosis not present

## 2022-09-08 NOTE — Therapy (Signed)
Demetri Kerman, Student-PT Student-PT Specialty: Student   Therapy     Cosign Needed   Creation Time: 09/08/2022  9:35 AM   Cosign Needed      OUTPATIENT PHYSICAL [place]     Patient Name: Cardell Rachel MRN: [id number] DOB:1951/04/16, 72 y.o., male Today's Date: 09/08/2022   END OF SESSION:   PT End of Session - 09/08/22 0934       Visit Number 14     Number of Visits 16     Date for PT Re-Evaluation 10/11/22     PT Start Time 0932     PT Stop Time 1018     PT Time Calculation (min) 46 min     Activity Tolerance Patient tolerated treatment well     Behavior During Therapy [facility] for tasks assessed/performed                       History reviewed. No pertinent past medical history. History reviewed. No pertinent surgical history.     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 ([place]) 12/15/2021   Polyneuropathy due to type 2 diabetes mellitus ([place]) 12/15/2021   Planovalgus deformity of foot, acquired, left 03/30/2021   Hyperglycemia due to type 2 diabetes mellitus ([place]) 02/10/2021   Hyperlipidemia 05/23/2018   Essential hypertension 05/21/2018   Diabetes mellitus ([place]) 05/21/2018      PCP: Sandi Mariscal, MD    REFERRING PROVIDER: Teresita Madura, PA-C   REFERRING DIAG: [phone] (ICD-10-CM) - Primary osteoarthritis, left ankle and foot   THERAPY DIAG:  Pain in left ankle and joints of left foot   Stiffness of left foot, not elsewhere classified   Muscle weakness (generalized)   Localized edema   Other abnormalities of gait and mobility   Rationale for Evaluation and Treatment: Rehabilitation   ONSET DATE: S/P Left ankle arthoplasty 03/16/22   SUBJECTIVE:    SUBJECTIVE STATEMENT: Similar to last week he does not report pain, only some stiffness after prolonged inactivity and some soreness on the outside of his foot.    PERTINENT  HISTORY: Multiple left ankle surgeries/OA, DM with neuropathy, left planovalgus deformity of foot PAIN:  NPRS scale: 0/10 Pain location: left ankle Pain description: ache, sore Aggravating factors: walking, standing to long Relieving factors: rest, ice   PRECAUTIONS: None   WEIGHT BEARING RESTRICTIONS: No longer any restrictions   FALLS:  Has patient fallen in last 6 months? Yes. Number of falls 2   LIVING ENVIRONMENT: Stairs: 7 steps   OCCUPATION: retired   PLOF: Independent   PATIENT GOALS: improve left ankle function and pain   NEXT MD VISIT:    OBJECTIVE:    DIAGNOSTIC FINDINGS:  06/30/22: Radiographs of the left ankle demonstrate TAA hardware in appropriate position. No evidence of fracture or hardware loosening. Arthrodesis hardware without evidence of failure, no persistent lucency. 1 broken screw noted medially which is chronic in nature   PATIENT SURVEYS:  FOTO Eval: 44% functional   COGNITION: Overall cognitive status: Within functional limits for tasks assessed                                    SENSATION: Light touch decreased some due to neuropathy   EDEMA:  Moderate edema in left anle   MUSCLE LENGTH: Heelcords tight  on left ankle   POSTURE:    PALPATION:     LOWER EXTREMITY ROM:   Active ROM/PROM Right eval Left eval Left 08/10/22 08/25/21  Hip flexion          Hip extension          Hip abduction          Hip adduction          Hip internal rotation          Hip external rotation          Knee flexion          Knee extension          Ankle dorsiflexion   2/5 6/ 7/  Ankle plantarflexion   20/35 25 25/  Ankle inversion   5/10 10 15/  Ankle eversion   5/10 15 10/   (Blank rows = not tested)   LOWER EXTREMITY MMT:   MMT Left eval Right 08/08/22 Right 08/25/21  Hip flexion        Hip extension        Hip abduction        Hip adduction        Hip internal rotation        Hip external rotation        Knee flexion        Knee  extension        Ankle dorsiflexion 4- 4+ 5  Ankle plantarflexion 4- 4 4+ in supine 3- in standing  Ankle inversion 4- 5 5  Ankle eversion 4- 5 5   (Blank rows = not tested)   LOWER EXTREMITY SPECIAL TESTS:      FUNCTIONAL TESTS:      GAIT: Eval Comments: ambulates in CAM boot with SPC for community distance, he can walk 10 feet without boot today or cane   08/17/22 Now is community Ambulator independently without device but does have some difficulty with stairs or prolonged walking     TODAY'S TREATMENT:  09/08/22 -Treadmill 2.0 mph 6% incline x81min -Gastroc stretch and soleus stretch 3 X 30 sec each on slantboard -Toe walking 4 round trips in bars with UE support  -Heel walking 4 round trips in bars with UE support  -Eccentric heel raises (up with both down with left only) 15, 10, 10  -Step up in front leading with left and down in front leading with Rt, 6 inch step X20 with UE suppport -Rocker board 1.5 min A-P  -Rocker board 1.5 min lateral  -Sit to stand to DL heel raise #20 kettlebell 2x10   -Vasopnuematic device X 10 min, high compression, 34 deg to Lt ankle   09/05/22 -Treadmill 2.0 mph 5% incline x39min -Gastroc stretch and soleus stretch 3 X 30 sec each on slantboard -SLS on left with UE support 10 sec hold x5 -Toe walking 4 round trips at treadmill with UE support  -Heel walking 4 round trips at treadmill with UE support  -Eccentric heel raises (up with both down with left only) 3X10 -Step up in front leading with left and down in front leading with Rt, 6 inch step X20 with UE suppport -Rocker board 1.5 min A-P  -Rocker board 1.5 min lateral    -Leg press DL 254# 2H06,  left leg calf press 3X10 31#     -Vasopnuematic device X 10 min, high compression, 34 deg to Lt ankle   09/01/22 -Recumbent bike L4  X 10 min -Gastroc stretch  and soleus stretch 3 X 30 sec each on slantboard -Toe walking 3 round trips at counter top  -Tandem walk 3 round trips at counter  top -heel walking 3 round trips at counter top -Eccentric heel raises (up with both down with left only) 3X10 -Step up in front leading with left and down in front leading with Rt, 6 inch step X20 with UE suppport -Rocker board 1.5 min A-P holding each stretch 3 seconds   -Leg press DL 143# 2X10,  left leg calf press 3X10 25#     -Vasopnuematic device X 10 min, high compression, 34 deg to Lt ankle     PATIENT EDUCATION: Education details: HEP, PT plan of care Person educated: Patient Education method: Explanation, Demonstration, Verbal cues, and Handouts Education comprehension: verbalized understanding and needs further education     HOME EXERCISE PROGRAM: Access Code: [email or web address] URL: [email or web address] Date: 07/19/2022 Prepared by: Elsie Ra   Exercises - Standing Gastroc Stretch at Counter  - 2-3 x daily - 6 x weekly - 1 sets - 3 reps - 30 hold - Standing Soleus Stretch at Counter  - 2-3 x daily - 6 x weekly - 1 sets - 3 reps - 30 hold - Side to Side Weight Shift with Counter Support  - 2-3 x daily - 6 x weekly - 1 sets - 15-20 reps - Staggered Stance Forward Backward Weight Shift with Counter Support  - 2-3 x daily - 6 x weekly - 1 sets - 15-20 reps - Heel Raises with Counter Support  - 2 x daily - 6 x weekly - 2-3 sets - 10 reps - A   ASSESSMENT:   CLINICAL IMPRESSION: He continues to tolerate additional strength exercises well. We focused on plantar flexion strength as that remains on of his main areas of weakness post surgery, adding in the sit to stand to heel raise. He will continue to benefit from skilled PT to address strength limitations in particular.    OBJECTIVE IMPAIRMENTS: decreased activity tolerance, difficulty walking, decreased balance, decreased endurance, decreased mobility, decreased ROM, decreased strength, impaired flexibility, impaired LE use, and pain.   ACTIVITY LIMITATIONS: bending, lifting, carry, locomotion, cleaning, community  activity, driving, and or occupation   PERSONAL FACTORS:Multiple left ankle surgeries/OA, DM with neuropathy, left planovalgus deformity of foot  are also affecting patient's functional outcome.   REHAB POTENTIAL: Good   CLINICAL DECISION MAKING: Stable/uncomplicated   EVALUATION COMPLEXITY: Low       GOALS: Short term PT Goals Target date: 08/16/2022     Pt will be I and compliant with HEP. Baseline:  Goal status: MET Pt will decrease pain by 25% overall Baseline: Goal status: MET   Long term PT goals Target date:10/11/2022     Pt will improve ROM to [facility] to improve functional mobility Baseline: Goal status: ongoing Pt will improve Lt ankle strength to at least 5/5 MMT in supine to improve functional strength Baseline: Goal status: ongoing Pt will improve FOTO to at least 48% functional to show improved function Baseline: Goal status: ongoing Pt will reduce pain to overall less than 3/10 with usual activity  Baseline: Goal status: ongoing Pt will be able to ambulate community distances at least 1000 ft WNL gait pattern without complaints and ambulate one flight of stairs Baseline: Goal status: ongoing   PLAN: PT FREQUENCY: 1-2 times per week    PT DURATION: 8-12 weeks   PLANNED INTERVENTIONS (unless contraindicated): aquatic PT, Canalith repositioning, cryotherapy, Electrical stimulation, Iontophoresis with 4  mg/ml dexamethasome, Moist heat, traction, Ultrasound, gait training, Therapeutic exercise, balance training, neuromuscular re-education, patient/family education, prosthetic training, manual techniques, passive ROM, dry needling, taping, vasopnuematic device, vestibular, spinal manipulations, joint manipulations   PLAN FOR NEXT SESSION: Take measurements. Toe walking first as this is the hardest for his so do it before he gets fatigued continued. Balance               Revision History Kimi Kroft, SPT

## 2022-09-08 NOTE — Therapy (Signed)
OUTPATIENT PHYSICAL THERAPYTREATMENT   Patient Name: Collin [name] MRN: [id number] DOB:06-18-1951, 72 y.o., male Today's Date: 09/08/2022  END OF SESSION:  PT End of Session - 09/08/22 0934     Visit Number 14    Number of Visits 16    Date for PT Re-Evaluation 10/11/22    PT Start Time 0932    PT Stop Time 1018    PT Time Calculation (min) 46 min    Activity Tolerance Patient tolerated treatment well    Behavior During Therapy [facility] for tasks assessed/performed               History reviewed. No pertinent past medical history. History reviewed. No pertinent surgical history. 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) 12/15/2021   Polyneuropathy due to type 2 diabetes mellitus (HCC) 12/15/2021   Planovalgus deformity of foot, acquired, left 03/30/2021   Hyperglycemia due to type 2 diabetes mellitus (HCC) 02/10/2021   Hyperlipidemia 05/23/2018   Essential hypertension 05/21/2018   Diabetes mellitus (HCC) 05/21/2018    PCP: Salli Real, MD   REFERRING PROVIDER: Elige Radon, PA-C  REFERRING DIAG: [phone] (ICD-10-CM) - Primary osteoarthritis, left ankle and foot  THERAPY DIAG:  Pain in left ankle and joints of left foot  Stiffness of left foot, not elsewhere classified  Muscle weakness (generalized)  Localized edema  Other abnormalities of gait and mobility  Rationale for Evaluation and Treatment: Rehabilitation  ONSET DATE: S/P Left ankle arthoplasty 03/16/22  SUBJECTIVE:   SUBJECTIVE STATEMENT: Similar to last week he does not report pain, only some stiffness after prolonged inactivity and some soreness on the outside of his foot.   PERTINENT HISTORY: Multiple left ankle surgeries/OA, DM with neuropathy, left planovalgus deformity of foot PAIN:  NPRS scale: 0/10 Pain location: left ankle Pain description: ache, sore Aggravating factors:  walking, standing to long Relieving factors: rest, ice  PRECAUTIONS: None  WEIGHT BEARING RESTRICTIONS: No longer any restrictions  FALLS:  Has patient fallen in last 6 months? Yes. Number of falls 2  LIVING ENVIRONMENT: Stairs: 7 steps  OCCUPATION: retired  PLOF: Independent  PATIENT GOALS: improve left ankle function and pain  NEXT MD VISIT:   OBJECTIVE:   DIAGNOSTIC FINDINGS:  06/30/22: Radiographs of the left ankle demonstrate TAA hardware in appropriate position. No evidence of fracture or hardware loosening. Arthrodesis hardware without evidence of failure, no persistent lucency. 1 broken screw noted medially which is chronic in nature  PATIENT SURVEYS:  FOTO Eval: 44% functional  COGNITION: Overall cognitive status: Within functional limits for tasks assessed     SENSATION: Light touch decreased some due to neuropathy  EDEMA:  Moderate edema in left anle  MUSCLE LENGTH: Heelcords tight on left ankle  POSTURE:   PALPATION:   LOWER EXTREMITY ROM:  Active ROM/PROM Right eval Left eval Left 08/10/22 08/25/21  Hip flexion      Hip extension      Hip abduction      Hip adduction      Hip internal rotation      Hip external rotation      Knee flexion      Knee extension      Ankle dorsiflexion  2/5 6/ 7/  Ankle plantarflexion  20/35 25 25/  Ankle inversion  5/10 10 15/  Ankle eversion  5/10 15 10/   (Blank rows = not tested)  LOWER EXTREMITY  MMT:  MMT Left eval Right 08/08/22 Right 08/25/21  Hip flexion     Hip extension     Hip abduction     Hip adduction     Hip internal rotation     Hip external rotation     Knee flexion     Knee extension     Ankle dorsiflexion 4- 4+ 5  Ankle plantarflexion 4- 4 4+ in supine 3- in standing  Ankle inversion 4- 5 5  Ankle eversion 4- 5 5   (Blank rows = not tested)  LOWER EXTREMITY SPECIAL TESTS:    FUNCTIONAL TESTS:    GAIT: Eval Comments: ambulates in CAM boot with SPC for community  distance, he can walk 10 feet without boot today or cane  08/17/22 Now is community Ambulator independently without device but does have some difficulty with stairs or prolonged walking   TODAY'S TREATMENT:  09/08/22 -Treadmill 2.0 mph 6% incline x4min -Gastroc stretch and soleus stretch 3 X 30 sec each on slantboard -Toe walking 4 round trips in bars with UE support  -Heel walking 4 round trips in bars with UE support  -Eccentric heel raises (up with both down with left only) 15, 10, 10  -Step up in front leading with left and down in front leading with Rt, 6 inch step X20 with UE suppport -Rocker board 1.5 min A-P  -Rocker board 1.5 min lateral  -Sit to stand to DL heel raise #20 kettlebell 2x10  -Vasopnuematic device X 10 min, high compression, 34 deg to Lt ankle  09/05/22 -Treadmill 2.0 mph 5% incline x64min -Gastroc stretch and soleus stretch 3 X 30 sec each on slantboard -SLS on left with UE support 10 sec hold x5 -Toe walking 4 round trips at treadmill with UE support  -Heel walking 4 round trips at treadmill with UE support  -Eccentric heel raises (up with both down with left only) 3X10 -Step up in front leading with left and down in front leading with Rt, 6 inch step X20 with UE suppport -Rocker board 1.5 min A-P  -Rocker board 1.5 min lateral   -Leg press DL 143# 2X16,  left leg calf press 3X10 31#   -Vasopnuematic device X 10 min, high compression, 34 deg to Lt ankle  09/01/22 -Recumbent bike L4  X 10 min -Gastroc stretch and soleus stretch 3 X 30 sec each on slantboard -Toe walking 3 round trips at counter top  -Tandem walk 3 round trips at counter top -heel walking 3 round trips at counter top -Eccentric heel raises (up with both down with left only) 3X10 -Step up in front leading with left and down in front leading with Rt, 6 inch step X20 with UE suppport -Rocker board 1.5 min A-P holding each stretch 3 seconds  -Leg press DL 143# 2X10,  left leg calf press  3X10 25#   -Vasopnuematic device X 10 min, high compression, 34 deg to Lt ankle   PATIENT EDUCATION: Education details: HEP, PT plan of care Person educated: Patient Education method: Explanation, Demonstration, Verbal cues, and Handouts Education comprehension: verbalized understanding and needs further education   HOME EXERCISE PROGRAM: Access Code: [email or web address] URL: [email or web address] Date: 07/19/2022 Prepared by: Elsie Ra  Exercises - Standing Gastroc Stretch at Counter  - 2-3 x daily - 6 x weekly - 1 sets - 3 reps - 30 hold - Standing Soleus Stretch at Counter  - 2-3 x daily - 6 x weekly - 1 sets - 3 reps - 30  hold - Side to Side Weight Shift with Counter Support  - 2-3 x daily - 6 x weekly - 1 sets - 15-20 reps - Staggered Stance Forward Backward Weight Shift with Counter Support  - 2-3 x daily - 6 x weekly - 1 sets - 15-20 reps - Heel Raises with Counter Support  - 2 x daily - 6 x weekly - 2-3 sets - 10 reps - A  ASSESSMENT:  CLINICAL IMPRESSION: He continues to tolerate additional strength exercises well. We focused on plantar flexion strength as that remains on of his main areas of weakness post surgery, adding in the sit to stand to heel raise. He will continue to benefit from skilled PT to address strength limitations in particular.   OBJECTIVE IMPAIRMENTS: decreased activity tolerance, difficulty walking, decreased balance, decreased endurance, decreased mobility, decreased ROM, decreased strength, impaired flexibility, impaired LE use, and pain.  ACTIVITY LIMITATIONS: bending, lifting, carry, locomotion, cleaning, community activity, driving, and or occupation  PERSONAL FACTORS:Multiple left ankle surgeries/OA, DM with neuropathy, left planovalgus deformity of foot  are also affecting patient's functional outcome.  REHAB POTENTIAL: Good  CLINICAL DECISION MAKING: Stable/uncomplicated  EVALUATION COMPLEXITY: Low    GOALS: Short term PT Goals  Target date: 08/16/2022   Pt will be I and compliant with HEP. Baseline:  Goal status: MET Pt will decrease pain by 25% overall Baseline: Goal status: MET  Long term PT goals Target date:10/11/2022   Pt will improve ROM to [facility] to improve functional mobility Baseline: Goal status: ongoing Pt will improve Lt ankle strength to at least 5/5 MMT in supine to improve functional strength Baseline: Goal status: ongoing Pt will improve FOTO to at least 48% functional to show improved function Baseline: Goal status: ongoing Pt will reduce pain to overall less than 3/10 with usual activity  Baseline: Goal status: ongoing Pt will be able to ambulate community distances at least 1000 ft WNL gait pattern without complaints and ambulate one flight of stairs Baseline: Goal status: ongoing  PLAN: PT FREQUENCY: 1-2 times per week   PT DURATION: 8-12 weeks  PLANNED INTERVENTIONS (unless contraindicated): aquatic PT, Canalith repositioning, cryotherapy, Electrical stimulation, Iontophoresis with 4 mg/ml dexamethasome, Moist heat, traction, Ultrasound, gait training, Therapeutic exercise, balance training, neuromuscular re-education, patient/family education, prosthetic training, manual techniques, passive ROM, dry needling, taping, vasopnuematic device, vestibular, spinal manipulations, joint manipulations  PLAN FOR NEXT SESSION: Take measurements. Toe walking first as this is the hardest for his so do it before he gets fatigued continued. Balance

## 2022-09-12 ENCOUNTER — Encounter: Payer: Self-pay | Admitting: Physical Therapy

## 2022-09-12 ENCOUNTER — Ambulatory Visit: Payer: 59 | Admitting: Physical Therapy

## 2022-09-12 DIAGNOSIS — R600 Localized edema: Secondary | ICD-10-CM | POA: Diagnosis not present

## 2022-09-12 DIAGNOSIS — M25675 Stiffness of left foot, not elsewhere classified: Secondary | ICD-10-CM

## 2022-09-12 DIAGNOSIS — M6281 Muscle weakness (generalized): Secondary | ICD-10-CM | POA: Diagnosis not present

## 2022-09-12 DIAGNOSIS — R2689 Other abnormalities of gait and mobility: Secondary | ICD-10-CM

## 2022-09-12 DIAGNOSIS — M25572 Pain in left ankle and joints of left foot: Secondary | ICD-10-CM

## 2022-09-12 NOTE — Therapy (Signed)
OUTPATIENT PHYSICAL [place]   Patient Name: Collin [name] MRN: [id number] DOB:[birth year], 72 y.o., male Today's Date: 09/12/2022  END OF SESSION:  PT End of Session - 09/12/22 0935     Visit Number 15    Number of Visits 16    Date for PT Re-Evaluation 10/11/22    PT Start Time 0932    PT Stop Time 1025    PT Time Calculation (min) 53 min    Activity Tolerance Patient tolerated treatment well    Behavior During Therapy [facility] for tasks assessed/performed               History reviewed. No pertinent past medical history. History reviewed. No pertinent surgical history. 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 ([place]) 12/15/2021   Polyneuropathy due to type 2 diabetes mellitus ([place]) 12/15/2021   Planovalgus deformity of foot, acquired, left 03/30/2021   Hyperglycemia due to type 2 diabetes mellitus ([place]) 02/10/2021   Hyperlipidemia 05/23/2018   Essential hypertension 05/21/2018   Diabetes mellitus ([place]) 05/21/2018    PCP: Sandi Mariscal, MD   REFERRING PROVIDER: Teresita Madura, PA-C  REFERRING DIAG: [phone] (ICD-10-CM) - Primary osteoarthritis, left ankle and foot  THERAPY DIAG:  Pain in left ankle and joints of left foot  Stiffness of left foot, not elsewhere classified  Muscle weakness (generalized)  Localized edema  Other abnormalities of gait and mobility  Rationale for Evaluation and Treatment: Rehabilitation  ONSET DATE: S/P Left ankle arthoplasty 03/16/22  SUBJECTIVE:   SUBJECTIVE STATEMENT: Today reported that he was feeling good with little to no pain in his ankle. He said that over the weekend his ankle felt achy after increased activity, but that ache has gone away now.  PERTINENT HISTORY: Multiple left ankle surgeries/OA, DM with neuropathy, left planovalgus deformity of foot PAIN:  NPRS scale: 0/10 Pain location: left ankle Pain  description: ache, sore Aggravating factors: walking, standing to long Relieving factors: rest, ice  PRECAUTIONS: None  WEIGHT BEARING RESTRICTIONS: No longer any restrictions  FALLS:  Has patient fallen in last 6 months? Yes. Number of falls 2  LIVING ENVIRONMENT: Stairs: 7 steps  OCCUPATION: retired  PLOF: Independent  PATIENT GOALS: improve left ankle function and pain  NEXT MD VISIT:   OBJECTIVE:   DIAGNOSTIC FINDINGS:  06/30/22: Radiographs of the left ankle demonstrate TAA hardware in appropriate position. No evidence of fracture or hardware loosening. Arthrodesis hardware without evidence of failure, no persistent lucency. 1 broken screw noted medially which is chronic in nature  PATIENT SURVEYS:  FOTO Eval: 44% functional  COGNITION: Overall cognitive status: Within functional limits for tasks assessed     SENSATION: Light touch decreased some due to neuropathy  EDEMA:  Moderate edema in left anle  MUSCLE LENGTH: Heelcords tight on left ankle  POSTURE:   PALPATION:   LOWER EXTREMITY ROM:  Active ROM/PROM Right eval Left eval Left 08/10/22 08/25/21 09/12/22  Hip flexion       Hip extension       Hip abduction       Hip adduction       Hip internal rotation       Hip external rotation       Knee flexion       Knee extension       Ankle dorsiflexion  2/5 6/ 7/ 5/  Ankle plantarflexion  20/35 25 25/ 20/  Ankle inversion  5/10 10 15/ 15/  Ankle eversion  5/10 15 10/ 10/   (Blank rows = not tested)  LOWER EXTREMITY MMT:  MMT Left eval Left 08/08/22 Left 08/25/21 Left 09/12/22  Hip flexion      Hip extension      Hip abduction      Hip adduction      Hip internal rotation      Hip external rotation      Knee flexion      Knee extension      Ankle dorsiflexion 4- 4+ 5 5  Ankle plantarflexion 4- 4 4+ in supine 3- in standing 5 in supine  3- in standing   Ankle inversion 4- 5 5 5   Ankle eversion 4- 5 5 5    (Blank rows = not  tested)  LOWER EXTREMITY SPECIAL TESTS:    FUNCTIONAL TESTS:    GAIT: Eval Comments: ambulates in CAM boot with SPC for community distance, he can walk 10 feet without boot today or cane  08/17/22 Now is community Ambulator independently without device but does have some difficulty with stairs or prolonged walking   TODAY'S TREATMENT:  09/12/22 -Treadmill 2.0 mph 6% incline x45min -Gastroc stretch and soleus stretch 3 X 30 sec each on slantboard -Toe walking 4 round trips in bars with UE support  -Heel walking 4 round trips in bars with UE support  -Eccentric heel raises (up with both down with left only) 2x15 -Rocker board 1.5 min A-P  -Rocker board 1.5 min lateral  -Leg press DL 09/14/22 4m,  single leg calf press 10, 10 31# each leg   -updated measurements  -Vasopnuematic device X 10 min, high compression, 34 deg to Lt ankle  09/08/22 -Treadmill 2.0 mph 6% incline x30min -Gastroc stretch and soleus stretch 3 X 30 sec each on slantboard -Toe walking 4 round trips in bars with UE support  -Heel walking 4 round trips in bars with UE support  -Eccentric heel raises (up with both down with left only) 15, 10, 10  -Step up in front leading with left and down in front leading with Rt, 6 inch step X20 with UE suppport -Rocker board 1.5 min A-P  -Rocker board 1.5 min lateral  -Sit to stand to DL heel raise 09/10/22 kettlebell 2x10  -Vasopnuematic device X 10 min, high compression, 34 deg to Lt ankle  09/05/22 -Treadmill 2.0 mph 5% incline x54min -Gastroc stretch and soleus stretch 3 X 30 sec each on slantboard -SLS on left with UE support 10 sec hold x5 -Toe walking 4 round trips at treadmill with UE support  -Heel walking 4 round trips at treadmill with UE support  -Eccentric heel raises (up with both down with left only) 3X10 -Step up in front leading with left and down in front leading with Rt, 6 inch step X20 with UE suppport -Rocker board 1.5 min A-P  -Rocker board 1.5 min  lateral   -Leg press DL 09/07/22 11m,  left leg calf press 3X10 31#   -Vasopnuematic device X 10 min, high compression, 34 deg to Lt ankle  09/01/22 -Recumbent bike L4  X 10 min -Gastroc stretch and soleus stretch 3 X 30 sec each on slantboard -Toe walking 3 round trips at counter top  -Tandem walk 3 round trips at counter top -heel walking 3 round trips at counter top -Eccentric heel raises (up with both down with left only) 3X10 -Step up in front leading with left and down in front leading with  Rt, 6 inch step X20 with UE suppport -Rocker board 1.5 min A-P holding each stretch 3 seconds  -Leg press DL 143# 2X10,  left leg calf press 3X10 25#   -Vasopnuematic device X 10 min, high compression, 34 deg to Lt ankle   PATIENT EDUCATION: Education details: HEP, PT plan of care Person educated: Patient Education method: Explanation, Demonstration, Verbal cues, and Handouts Education comprehension: verbalized understanding and needs further education   HOME EXERCISE PROGRAM: Access Code: [email or web address] URL: [email or web address] Date: 07/19/2022 Prepared by: Elsie Ra  Exercises - Standing Gastroc Stretch at Counter  - 2-3 x daily - 6 x weekly - 1 sets - 3 reps - 30 hold - Standing Soleus Stretch at Counter  - 2-3 x daily - 6 x weekly - 1 sets - 3 reps - 30 hold - Side to Side Weight Shift with Counter Support  - 2-3 x daily - 6 x weekly - 1 sets - 15-20 reps - Staggered Stance Forward Backward Weight Shift with Counter Support  - 2-3 x daily - 6 x weekly - 1 sets - 15-20 reps - Heel Raises with Counter Support  - 2 x daily - 6 x weekly - 2-3 sets - 10 reps   ASSESSMENT:  CLINICAL IMPRESSION: He continues to show limitations in plantarflexion strength in standing and will benefit from skilled PT to improve strength in this area. We updated measurements and his ROM has stayed and could still use some improvement. His last scheduled visit is scheduled for Thursday and was  advised to continue with therapy to keep progressing.   OBJECTIVE IMPAIRMENTS: decreased activity tolerance, difficulty walking, decreased balance, decreased endurance, decreased mobility, decreased ROM, decreased strength, impaired flexibility, impaired LE use, and pain.  ACTIVITY LIMITATIONS: bending, lifting, carry, locomotion, cleaning, community activity, driving, and or occupation  PERSONAL FACTORS:Multiple left ankle surgeries/OA, DM with neuropathy, left planovalgus deformity of foot  are also affecting patient's functional outcome.  REHAB POTENTIAL: Good  CLINICAL DECISION MAKING: Stable/uncomplicated  EVALUATION COMPLEXITY: Low    GOALS: Short term PT Goals Target date: 08/16/2022   Pt will be I and compliant with HEP. Baseline:  Goal status: MET Pt will decrease pain by 25% overall Baseline: Goal status: MET  Long term PT goals Target date:10/11/2022   Pt will improve ROM to [facility] to improve functional mobility Baseline: Goal status: ongoing Pt will improve Lt ankle strength to at least 5/5 MMT in supine to improve functional strength Baseline: Goal status: ongoing Pt will improve FOTO to at least 48% functional to show improved function Baseline: Goal status: ongoing Pt will reduce pain to overall less than 3/10 with usual activity  Baseline: Goal status: ongoing Pt will be able to ambulate community distances at least 1000 ft WNL gait pattern without complaints and ambulate one flight of stairs Baseline: Goal status: ongoing  PLAN: PT FREQUENCY: 1-2 times per week   PT DURATION: 8-12 weeks  PLANNED INTERVENTIONS (unless contraindicated): aquatic PT, Canalith repositioning, cryotherapy, Electrical stimulation, Iontophoresis with 4 mg/ml dexamethasome, Moist heat, traction, Ultrasound, gait training, Therapeutic exercise, balance training, neuromuscular re-education, patient/family education, prosthetic training, manual techniques, passive ROM, dry needling,  taping, vasopnuematic device, vestibular, spinal manipulations, joint manipulations  PLAN FOR NEXT SESSION: Plantar flexion strength and balance. Re cert  Faith Branan, SPT

## 2022-09-15 ENCOUNTER — Encounter: Payer: Self-pay | Admitting: Physical Therapy

## 2022-09-15 ENCOUNTER — Ambulatory Visit: Payer: 59 | Admitting: Physical Therapy

## 2022-09-15 DIAGNOSIS — M25675 Stiffness of left foot, not elsewhere classified: Secondary | ICD-10-CM | POA: Diagnosis not present

## 2022-09-15 DIAGNOSIS — M25572 Pain in left ankle and joints of left foot: Secondary | ICD-10-CM

## 2022-09-15 DIAGNOSIS — R600 Localized edema: Secondary | ICD-10-CM | POA: Diagnosis not present

## 2022-09-15 DIAGNOSIS — M6281 Muscle weakness (generalized): Secondary | ICD-10-CM

## 2022-09-15 DIAGNOSIS — R2689 Other abnormalities of gait and mobility: Secondary | ICD-10-CM

## 2022-09-15 NOTE — Therapy (Addendum)
OUTPATIENT PHYSICAL [place]   Patient Name: Collin [name] MRN: [id number] DOB:05/23/1951, 72 y.o., male Today's Date: 09/15/2022  END OF SESSION:  PT End of Session - 09/15/22 0928     Visit Number 16    Number of Visits 16    Date for PT Re-Evaluation 10/11/22    PT Start Time 0925    PT Stop Time 1015    PT Time Calculation (min) 50 min    Activity Tolerance Patient tolerated treatment well    Behavior During Therapy [facility] for tasks assessed/performed               History reviewed. No pertinent past medical history. History reviewed. No pertinent surgical history. 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 ([place]) 12/15/2021   Polyneuropathy due to type 2 diabetes mellitus ([place]) 12/15/2021   Planovalgus deformity of foot, acquired, left 03/30/2021   Hyperglycemia due to type 2 diabetes mellitus ([place]) 02/10/2021   Hyperlipidemia 05/23/2018   Essential hypertension 05/21/2018   Diabetes mellitus ([place]) 05/21/2018    PCP: Sandi Mariscal, MD   REFERRING PROVIDER: Teresita Madura, PA-C  REFERRING DIAG: [phone] (ICD-10-CM) - Primary osteoarthritis, left ankle and foot  THERAPY DIAG:  Pain in left ankle and joints of left foot  Stiffness of left foot, not elsewhere classified  Muscle weakness (generalized)  Localized edema  Other abnormalities of gait and mobility  Rationale for Evaluation and Treatment: Rehabilitation  ONSET DATE: S/P Left ankle arthoplasty 03/16/22  SUBJECTIVE:   SUBJECTIVE STATEMENT: Relays his ankle is doing good, he feels it may be his neuropathy that holds him back more than anything. He wants to try independent program for now.  PERTINENT HISTORY: Multiple left ankle surgeries/OA, DM with neuropathy, left planovalgus deformity of foot PAIN:  NPRS scale: 0/10 Pain location: left ankle Pain description: ache, sore Aggravating  factors: walking, standing to long Relieving factors: rest, ice  PRECAUTIONS: None  WEIGHT BEARING RESTRICTIONS: No longer any restrictions  FALLS:  Has patient fallen in last 6 months? Yes. Number of falls 2  LIVING ENVIRONMENT: Stairs: 7 steps  OCCUPATION: retired  PLOF: Independent  PATIENT GOALS: improve left ankle function and pain  NEXT MD VISIT:   OBJECTIVE:   DIAGNOSTIC FINDINGS:  06/30/22: Radiographs of the left ankle demonstrate TAA hardware in appropriate position. No evidence of fracture or hardware loosening. Arthrodesis hardware without evidence of failure, no persistent lucency. 1 broken screw noted medially which is chronic in nature  PATIENT SURVEYS:  FOTO Eval: 44% functional FOTO 09/15/22: 68% and met goal  COGNITION: Overall cognitive status: Within functional limits for tasks assessed     SENSATION: Light touch decreased some due to neuropathy  EDEMA:  Moderate edema in left anle  MUSCLE LENGTH: Heelcords tight on left ankle  POSTURE:   PALPATION:   LOWER EXTREMITY ROM:  Active ROM/PROM Right eval Left eval Left 08/10/22 08/25/21 09/12/22  Hip flexion       Hip extension       Hip abduction       Hip adduction       Hip internal rotation       Hip external rotation       Knee flexion       Knee extension       Ankle dorsiflexion  2/5 6/ 7/ 5/  Ankle plantarflexion  20/35 25 25/ 20/  Ankle inversion  5/10 10 15/ 15/  Ankle eversion  5/10 15 10/ 10/   (Blank rows = not tested)  LOWER EXTREMITY MMT:  MMT Left eval Left 08/08/22 Left 08/25/21 Left 09/12/22  Hip flexion      Hip extension      Hip abduction      Hip adduction      Hip internal rotation      Hip external rotation      Knee flexion      Knee extension      Ankle dorsiflexion 4- 4+ 5 5  Ankle plantarflexion 4- 4 4+ in supine 3- in standing 5 in supine  3- in standing   Ankle inversion 4- '5 5 5  '$ Ankle eversion 4- '5 5 5   '$ (Blank rows = not tested)  LOWER  EXTREMITY SPECIAL TESTS:    FUNCTIONAL TESTS:    GAIT: Eval Comments: ambulates in CAM boot with SPC for community distance, he can walk 10 feet without boot today or cane  08/17/22 Now is community Ambulator independently without device but does have some difficulty with stairs or prolonged walking   TODAY'S TREATMENT:  09/15/22 -Treadmill 2.0 mph 6% incline x7mn -Gastroc stretch and soleus stretch 3 X 30 sec each on slantboard -Toe walking 4 round trips in bars with UE support  -Heel walking 4 round trips in bars with UE support  -Eccentric heel raises (up with both down with left only) 2x15 -Rocker board 1.5 min A-P  -Rocker board 1.5 min lateral  -Leg press DL 150# 2X15,  single leg calf press 3 X 10, 31# each leg  -Calf raises SL 2X10, then eccentrics for left 2X10, then double leg 2X10 holding 3 sec  -Vasopnuematic device X 10 min, high compression, 34 deg to Lt ankle   PATIENT EDUCATION: Education details: HEP, PT plan of care Person educated: Patient Education method: Explanation, Demonstration, Verbal cues, and Handouts Education comprehension: verbalized understanding and needs further education   HOME EXERCISE PROGRAM  Exercises 2 times per day, 6 days per week - toe walking 3 X 10 -Heel walking 3X10 -Tandem walk 3X10 -Calf raises SL 2X10, then eccentrics for left 2X10, then double leg 2X10   ASSESSMENT:  CLINICAL IMPRESSION: He has done well with PT and has met all goals but PF strength goal. At this point he feels ready to hold PT to try independent program and he can come back within 30 days if he feels he needs to. He understands that beyond 30 days he will need new PT referral.  OBJECTIVE IMPAIRMENTS: decreased activity tolerance, difficulty walking, decreased balance, decreased endurance, decreased mobility, decreased ROM, decreased strength, impaired flexibility, impaired LE use, and pain.  ACTIVITY LIMITATIONS: bending, lifting, carry, locomotion,  cleaning, community activity, driving, and or occupation  PERSONAL FACTORS:Multiple left ankle surgeries/OA, DM with neuropathy, left planovalgus deformity of foot  are also affecting patient's functional outcome.  REHAB POTENTIAL: Good  CLINICAL DECISION MAKING: Stable/uncomplicated  EVALUATION COMPLEXITY: Low    GOALS: Short term PT Goals Target date: 08/16/2022   Pt will be I and compliant with HEP. Baseline:  Goal status: MET Pt will decrease pain by 25% overall Baseline: Goal status: MET  Long term PT goals Target date:10/11/2022   Pt [facility] Baseline: Goal status: MET 09/15/22 Pt will improve Lt ankle strength to at least 5/5 MMT in supine to improve functional strength Baseline: Goal status: partially met, still some weakness with PF  Pt will improve FOTO to at least 48% functional to show improved function Baseline: Goal status: MET 09/15/22, 68% Pt will reduce pain to overall less than 3/10 with usual activity  Baseline: Goal status: MET 09/15/22 Pt will be able to ambulate community distances at least 1000 ft WNL gait pattern without complaints and ambulate one flight of stairs Baseline: Goal status: MET 09/15/22  PLAN: PT FREQUENCY: 1-2 times per week   PT DURATION: 8-12 weeks  PLANNED INTERVENTIONS (unless contraindicated): aquatic PT, Canalith repositioning, cryotherapy, Electrical stimulation, Iontophoresis with 4 mg/ml dexamethasome, Moist heat, traction, Ultrasound, gait training, Therapeutic exercise, balance training, neuromuscular re-education, patient/family education, prosthetic training, manual techniques, passive ROM, dry needling, taping, vasopnuematic device, vestibular, spinal manipulations, joint manipulations  PLAN FOR NEXT SESSION: Hold PT to work on independent program for up to 30 days and if after that he will need new referral.  Elsie Ra, PT, DPT 09/15/22 9:31 AM  PHYSICAL THERAPY  DISCHARGE SUMMARY  Visits from Start of Care: 16  Current functional level related to goals / functional outcomes: See above   Remaining deficits: See above   Education / Equipment: HEP  Plan:  Patient goals were met. Patient is being discharged due to not returning since last visit >30 days

## 2022-10-28 ENCOUNTER — Other Ambulatory Visit: Payer: Self-pay

## 2022-10-28 MED ORDER — METOPROLOL TARTRATE 25 MG PO TABS: 25.0000 mg | ORAL_TABLET | Freq: Two times a day (BID) | ORAL | 3 refills | Status: DC

## 2022-11-07 ENCOUNTER — Other Ambulatory Visit: Payer: Self-pay | Admitting: Cardiovascular Disease

## 2022-11-07 DIAGNOSIS — I25118 Atherosclerotic heart disease of native coronary artery with other forms of angina pectoris: Secondary | ICD-10-CM

## 2023-02-07 ENCOUNTER — Ambulatory Visit: Payer: 59 | Admitting: Cardiovascular Disease

## 2023-02-13 ENCOUNTER — Other Ambulatory Visit: Payer: Self-pay | Admitting: Cardiovascular Disease

## 2023-02-28 ENCOUNTER — Ambulatory Visit: Payer: 59 | Admitting: Cardiovascular Disease

## 2023-03-21 ENCOUNTER — Encounter: Payer: Self-pay | Admitting: Cardiology

## 2023-03-21 ENCOUNTER — Ambulatory Visit (INDEPENDENT_AMBULATORY_CARE_PROVIDER_SITE_OTHER): Payer: 59 | Admitting: Cardiovascular Disease

## 2023-03-21 VITALS — BP 100/55 | HR 75 | Ht 72.0 in | Wt 231.0 lb

## 2023-03-21 DIAGNOSIS — I10 Essential (primary) hypertension: Secondary | ICD-10-CM

## 2023-03-21 DIAGNOSIS — I25118 Atherosclerotic heart disease of native coronary artery with other forms of angina pectoris: Secondary | ICD-10-CM | POA: Diagnosis not present

## 2023-03-21 DIAGNOSIS — E782 Mixed hyperlipidemia: Secondary | ICD-10-CM

## 2023-03-21 NOTE — Assessment & Plan Note (Signed)
Controlled today, denies dizziness.

## 2023-03-21 NOTE — Assessment & Plan Note (Signed)
LHC 10/2017 70% mid-LAD stenosis, mid LAD stenosis right after the ostium of large D2 branch, 70% ostial moderate D1 and large D2 branch stenoses. Successful bifurcation PCI of the mid LAD coronary artery lesion, implantation of a 3.5 x 18 mm Xience Sierra DES. Final kissing balloon of jailed second diag/LAD. Denies chest pain, feeling easily fatigued, lethargic easily. Will repeat stress test to check for stent patency.

## 2023-03-21 NOTE — Assessment & Plan Note (Signed)
-  On atorvastatin 80mg

## 2023-03-21 NOTE — Progress Notes (Signed)
Cardiology Office Note   Date:  03/21/2023   ID:  Collin [name], DOB Oct 25, 1950, MRN [id number]  PCP:  Collin Fick, MD  Cardiologist:  Collin Ivan, NP      History of Present Illness: Collin [name] is a 72 y.o. male who presents for  Chief Complaint  Patient presents with   Follow-up    6 mo F/U    Patient in office for routine cardiac exam. Denies chest pain, shortness of breath, dizziness, lower extremity edema. Tires easily, lethargy, started taking a testosterone supplement, helping some. Reports falling asleep easily in the middle of the day. Sleep study 04/2021 normal.     Past Medical History:  Diagnosis Date   Hyperglycemia due to type 2 diabetes mellitus (HCC) 02/10/2021     History reviewed. No pertinent surgical history.   Current Outpatient Medications  Medication Sig Dispense Refill   aspirin EC 81 MG tablet Take 81 mg by mouth daily. Swallow whole.     atorvastatin (LIPITOR) 80 MG tablet Take 80 mg by mouth at bedtime.     dicyclomine (BENTYL) 10 MG capsule Take 10 mg by mouth 4 (four) times daily -  before meals and at bedtime.     gabapentin (NEURONTIN) 100 MG capsule Take 100 mg by mouth 3 (three) times daily.     isosorbide mononitrate (IMDUR) 30 MG 24 hr tablet TAKE 1 TABLET BY MOUTH EVERY MORNING 30 tablet 2   JARDIANCE 10 MG TABS tablet Take 10 mg by mouth daily.     losartan (COZAAR) 25 MG tablet Take 25 mg by mouth daily.     metFORMIN (GLUCOPHAGE) 1000 MG tablet Take 1,000 mg by mouth 2 (two) times daily.     metoprolol succinate (TOPROL-XL) 25 MG 24 hr tablet TAKE ONE TABLET BY MOUTH ONE TIME DAILY 90 tablet 1   NOVOLOG 100 UNIT/ML injection Inject into the skin.     omeprazole (PRILOSEC) 40 MG capsule Take 40 mg by mouth daily.     traZODone (DESYREL) 100 MG tablet Take 100 mg by mouth at bedtime.     clotrimazole (LOTRIMIN) 1 % cream Apply topically 2 (two) times daily as needed.     PRESCRIPTION MEDICATION Domperidone 10mg  TID      No current facility-administered medications for this visit.    Allergies:   Patient has no known allergies.    Social History:   reports that he has never smoked. He has never used smokeless tobacco. He reports that he does not currently use alcohol. No history on file for drug use.   Family History:  family history is not on file.    ROS:     Review of Systems  Constitutional:  Positive for malaise/fatigue.  HENT: Negative.    Eyes: Negative.   Respiratory: Negative.    Cardiovascular: Negative.   Gastrointestinal: Negative.   Genitourinary: Negative.   Musculoskeletal: Negative.   Skin: Negative.   Neurological: Negative.   Endo/Heme/Allergies: Negative.   Psychiatric/Behavioral: Negative.    All other systems reviewed and are negative.     All other systems are reviewed and negative.    PHYSICAL EXAM: VS:  BP (!) 100/55   Pulse 75   Ht 6' (1.829 m)   Wt 231 lb (104.8 kg)   SpO2 (!) 89%   BMI 31.33 kg/m  , BMI Body mass index is 31.33 kg/m. Last weight:  Wt Readings from Last 3 Encounters:  03/21/23 231 lb (104.8 kg)  Physical Exam Vitals and nursing note reviewed.  Constitutional:      Appearance: Normal appearance. He is normal weight.  HENT:     Head: Normocephalic and atraumatic.     Nose: Nose normal.     Mouth/Throat:     Mouth: Mucous membranes are moist.     Pharynx: Oropharynx is clear.  Eyes:     Extraocular Movements: Extraocular movements intact.     Conjunctiva/sclera: Conjunctivae normal.     Pupils: Pupils are equal, round, and reactive to light.  Cardiovascular:     Rate and Rhythm: Normal rate and regular rhythm.     Pulses: Normal pulses.     Heart sounds: Normal heart sounds.  Pulmonary:     Effort: Pulmonary effort is normal.     Breath sounds: Normal breath sounds.  Abdominal:     General: Abdomen is flat. Bowel sounds are normal.     Palpations: Abdomen is soft.  Musculoskeletal:        General: Normal range of  motion.     Cervical back: Normal range of motion.  Skin:    General: Skin is warm and dry.  Neurological:     General: No focal deficit present.     Mental Status: He is alert and oriented to person, place, and time. Mental status is at baseline.  Psychiatric:        Mood and Affect: Mood normal.        Behavior: Behavior normal.        Thought Content: Thought content normal.        Judgment: Judgment normal.      EKG: none today  Recent Labs: No results found for requested labs within last 365 days.    Lipid Panel No results found for: "CHOL", "TRIG", "HDL", "CHOLHDL", "VLDL", "LDLCALC", "LDLDIRECT"    Other studies Reviewed: Patient: [id number] - Collin [name] DOB:  [birth year]    Date:  03/11/2021 08:00 Provider: Adrian Blackwater MD Encounter: [facility]                                                                                        [facility] [address] [place], [place] [address] [phone] STUDY:  Gated Stress / Rest Myocardial Perfusion Imaging Tomographic (SPECT) Including attenuation correction Wall Motion, Left Ventricular Ejection [name] By Gated Technique.Treadmill Stress Test. SEX: Male    WEIGHT: 235 lbs   HEIGHT: 72 in      ARMS UP: YES/NO                                                                        REFERRING PHYSICIAN: Dr.Shaukat Welton Flakes  INDICATION FOR STUDY:   SOB                                                                                                                                                                                                                  TECHNIQUE:  Approximately 20 minutes following the intravenous administration of 10.3 mCi of Tc-36m Sestamibi after stress testing in a  reclined supine position with arms above their head if able to do so, gated SPECT imaging of the heart was performed. After about a 2hr break, the patient was injected intravenously with 31.1 mCi of Tc-76m Sestamibi.  Approximately 45 minutes later in the same position as stress imaging SPECT rest imaging of the heart was performed.  STRESS BY:  Collin Blackwater, MD PROTOCOL:   Smitty Cords                                                                                       MAX PRED HR: 152                    85%: 129               75%: 114                                                                                                                   RESTING BP: 128/64  RESTING HR: 74  PEAK BP: 154/74   PEAK HR: 118 (77%)  EXERCISE DURATION: 4:31                                             METS: 5.7     REASON FOR TEST TERMINATION: Leg pain. SOB.                                                                                                                                  SYMPTOMS: Leg pain. SOB.  [place] TREADMILL SCORE:  5                                      RISK: Low                                                                                                                                                                                                            EKG RESULTS: NSR. 74/min. No significant ST changes at peak exercise.                                                             IMAGE QUALITY: Good  PERFUSION/WALL MOTION FINDINGS: EF = 67%. No perfusion defects, normal wall motion.                                                                           IMPRESSION: Normal stress test with normal LVEF.                                                                                                                                                                                                                                                                                        Collin Blackwater, MD Stress Interpreting Physician / Nuclear Interpreting Physician  Collin Blackwater MD  Electronically signed by: Collin [name]     Date: 03/12/2021 14:46  Patient: [id number] - Collin [name] DOB:  06-Mar-1951 SSN:    Date:  03/11/2021 13:30 Provider: Adrian Blackwater MD Encounter: ECHO   TESTS  Imaging: Echocardiogram:  An echocardiogram in (2-d) mode was performed and in Doppler mode with color flow velocity mapping was performed. The aortic valve cusps are abnormal 1.5  cm, flow velocity 1.4   m/s, and systolic calculated mean flow gradient 4  mmHg. Mitral valve diastolic peak flow velocity E 0.6   m/s and E/A ratio 0.7. Aortic root diameter 4.2   cm. The LVOT internal diameter 2.2  cm and flow velocity was abnormal 0.9   m/s. LV systolic dimension 2.3  cm, diastolic 3.4  cm, posterior wall thickness 1.3   cm, fractional shortening 32 %, and EF 60 %. IVS thickness 1.5   cm. LA dimension 3.2 cm  RIGHT atrium=  14.7  cm2. Mitral Valve =  Ea= 7.8  DT= 194 msec. Mitral Valve is Normal. Tricuspid Valve =  TR jet V=   2   RAP= 5  RVSP=  21   mmHg. Aortic Valve is Normal. Pulmonic Valve= PIEDV= 1.2   m/s. Pulmonic Valve has  Mild Regurgitation. Tricuspid Valve has Mild Regurgitation.     ASSESSMENT  Technically adequate study.  Ejection [name]-60  Left Ventricle- Normal size   Left Ventricle diastolic dysfunction grade-Grade 1 relaxation abnormality  Mild Left ventricular hypertrophy  Right Ventricle- Mildly dilated  Normal right ventricular wall motion  Left Atrium-Mildly dilated  Right Atrium-Normal size   Aortic valve-Mild calcification of aortic valve cusps with No stenosis, No regurgitation  Pulmonic Valve-No stenosis, mild regurgitation  Mitral Valve-No regurgitation  Tricuspid Valve-Mild Regurgitation  No Pericardial effusion.   THERAPY   Referring physician: Laurier Nancy  Sonographer: Lenor Derrick.   Collin Blackwater MD  Electronically signed by: Collin [name]     Date: 03/12/2021 14:52    ASSESSMENT AND PLAN:    ICD-10-CM   1. Atherosclerotic heart disease of native coronary artery with other forms of angina pectoris (HCC)  I25.118 MYOCARDIAL PERFUSION IMAGING    2. Essential hypertension  I10 PCV ECHOCARDIOGRAM COMPLETE    3. Mixed hyperlipidemia  E78.2        Problem List Items Addressed This Visit       Cardiovascular and Mediastinum   Atherosclerotic heart disease of native coronary artery with other forms of angina pectoris (HCC) - Primary    LHC 10/2017 70% mid-LAD stenosis, mid LAD stenosis right after the ostium of large D2 branch, 70% ostial moderate D1 and large D2 branch stenoses. Successful bifurcation PCI of the mid LAD coronary artery lesion, implantation of a 3.5 x 18 mm Xience Sierra DES. Final kissing balloon of jailed second diag/LAD. Denies chest pain, feeling easily fatigued, lethargic easily. Will repeat stress test to check for stent patency.       Relevant Orders   MYOCARDIAL PERFUSION IMAGING   Essential hypertension    Controlled today, denies dizziness.       Relevant Orders   PCV ECHOCARDIOGRAM COMPLETE     Other   Hyperlipidemia    On atorvastatin 80 mg.         Disposition:   Return in about 4 weeks (around 04/18/2023) for after echo and stress test.    Total time spent: 30 minutes  Signed,  Collin Ivan, NP  03/21/2023 4:16 PM    [facility]

## 2023-03-31 ENCOUNTER — Other Ambulatory Visit: Payer: 59

## 2023-04-01 IMAGING — MR MR ANKLE*L* W/O CM
5 series · 36 of 40 positions shown · non-contrast
Comparison: None.

CLINICAL DATA: Left ankle pain for years.

EXAM:
MRI OF THE LEFT ANKLE WITHOUT CONTRAST
TECHNIQUE: Multiplanar, multisequence MR imaging of the ankle was performed. No
intravenous contrast was administered.

[Series 4: T2 fat-sat · axial · 3.0mm · 0.50mm/px · z∈[-97,+35]mm · 8 of 35 slices shown (1 of 2)]
[im 1/35]
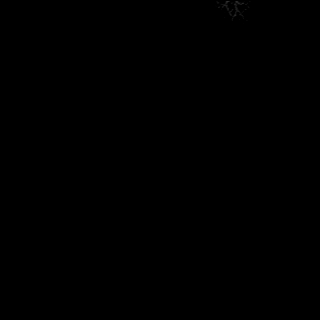
[im 5/35]
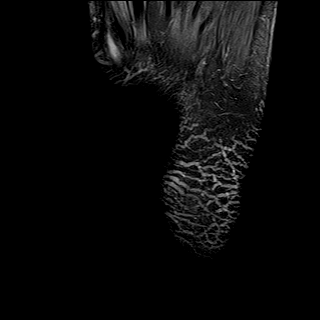
[im 10/35]
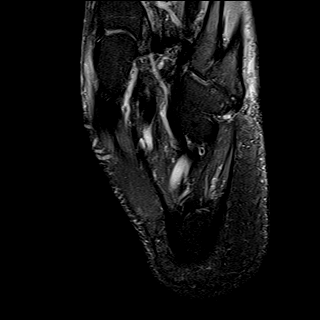
[im 15/35]
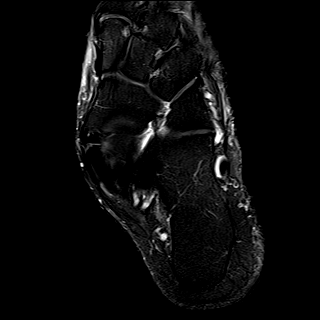
[im 20/35]
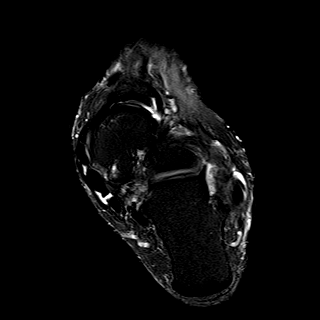
[im 25/35]
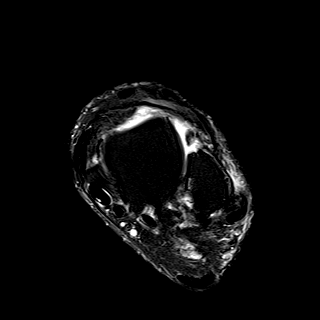
[im 30/35]
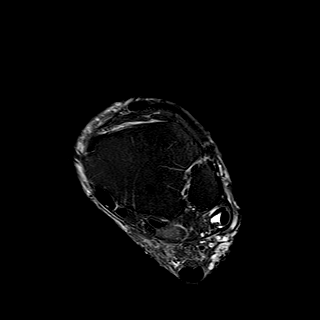
[im 35/35]
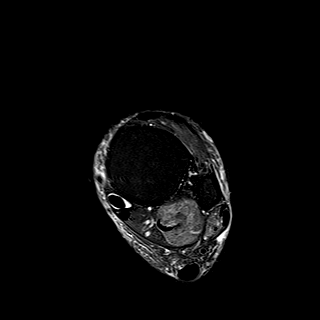

[Series 5: PD fat-sat · axial · 3.0mm · 0.50mm/px · z∈[-97,+35]mm · 8 of 35 slices shown]
[im 1/35]
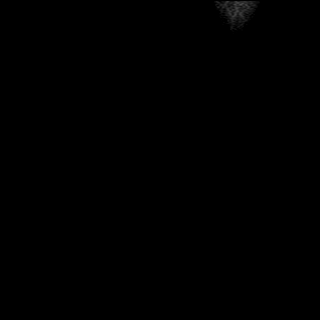
[im 5/35]
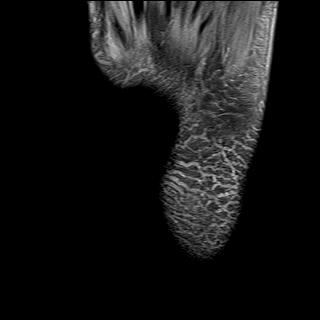
[im 10/35]
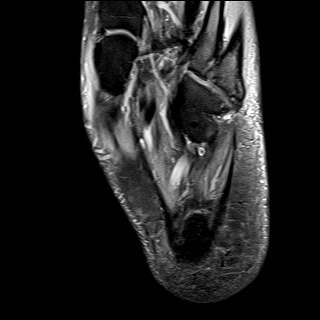
[im 15/35]
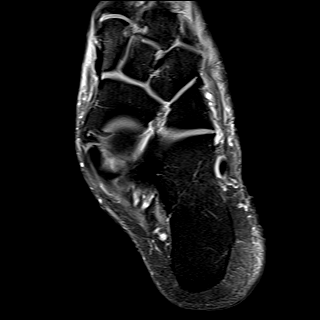
[im 20/35]
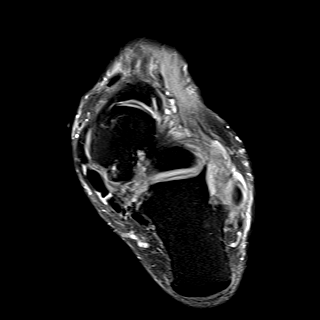
[im 25/35]
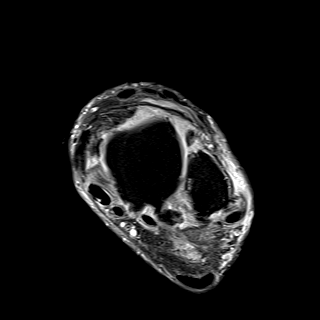
[im 30/35]
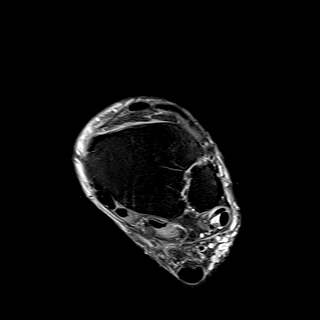
[im 35/35]
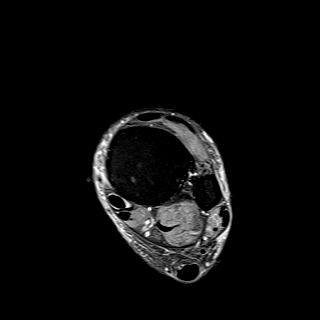

[Series 6: T1 · sagittal · 4.0mm · 0.56mm/px · 7 of 27 slices shown]
[im 1/27]
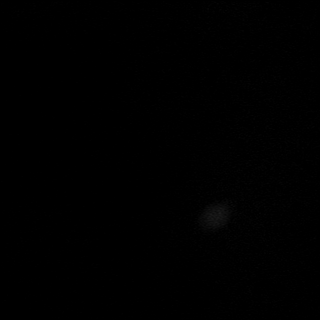
[im 5/27]
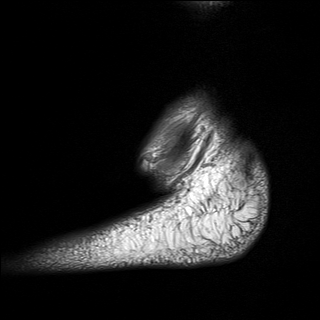
[im 9/27]
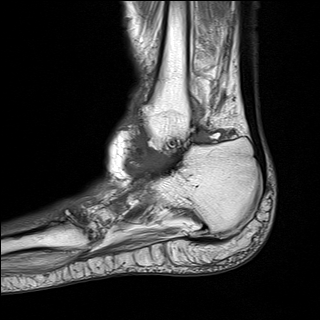
[im 14/27]
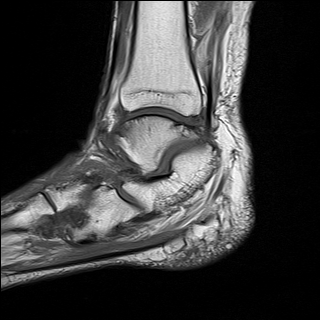
[im 18/27]
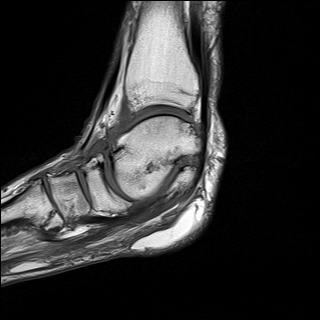
[im 22/27]
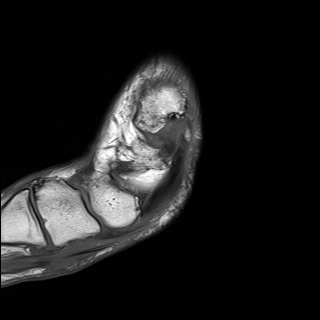
[im 27/27]
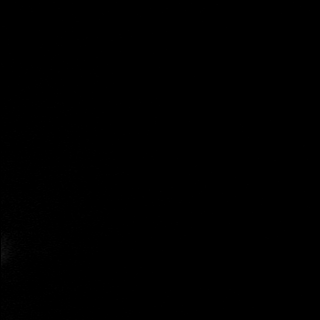

[Series 7: STIR · sagittal · 4.0mm · 0.35mm/px · 5 of 27 slices shown]
[im 1/27]
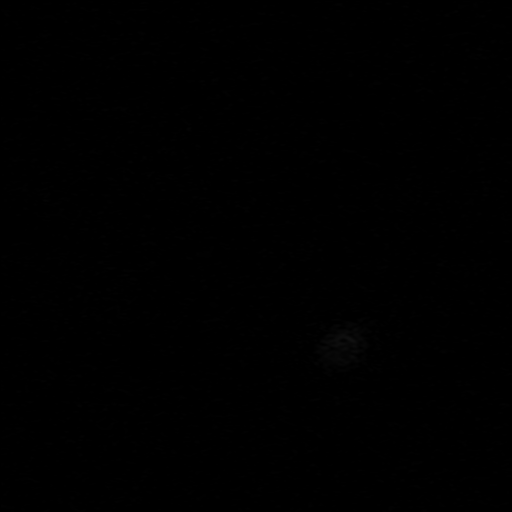
[im 5/27]
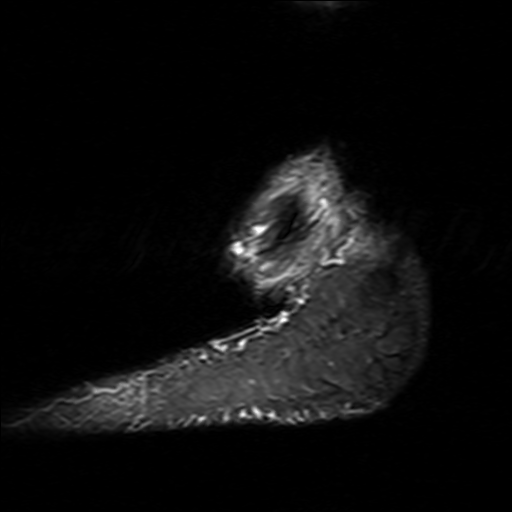
[im 9/27]
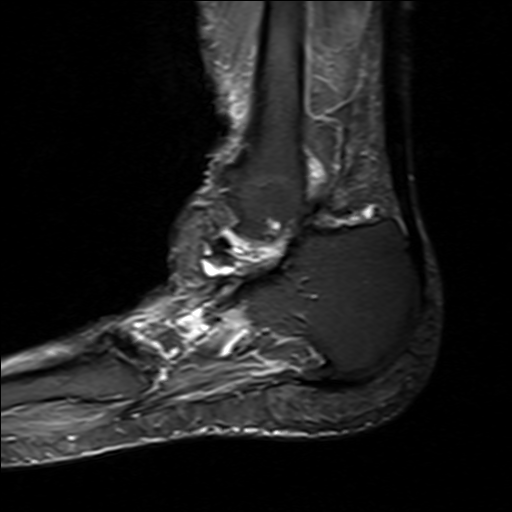
[im 14/27]
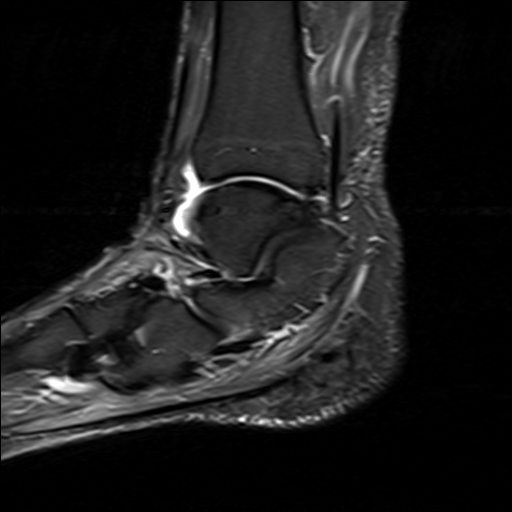
[im 18/27]
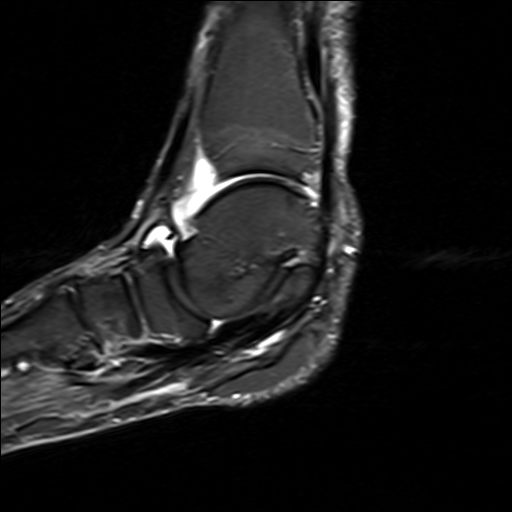

[Series 8: T2 fat-sat · coronal · 3.0mm · 0.50mm/px · 8 of 41 slices shown (2 of 2)]
[im 1/41]
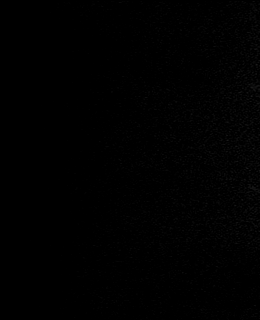
[im 5/41]
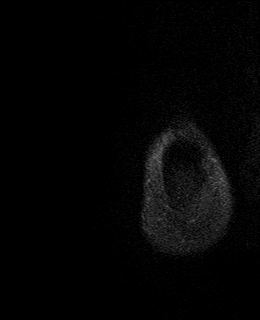
[im 14/41]
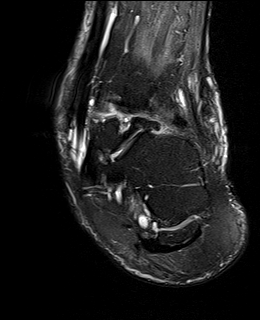
[im 18/41]
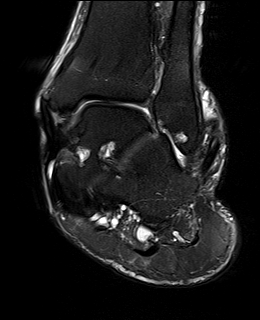
[im 23/41]
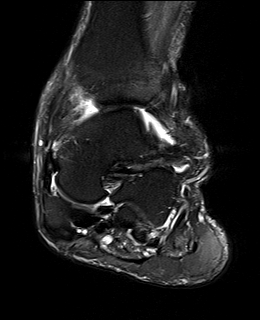
[im 27/41]
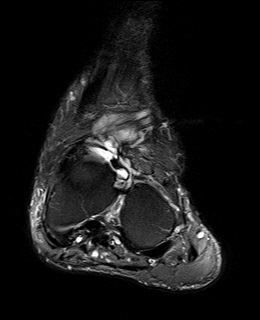
[im 36/41]
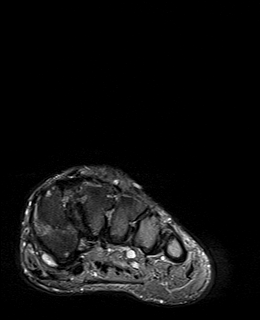
[im 41/41]
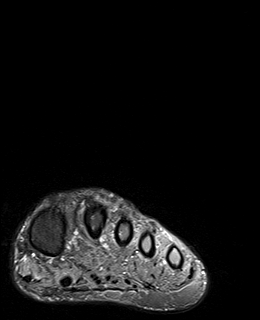

[36 of 40 positions shown; findings below may reference images not displayed]

FINDINGS: TENDONS

Peroneal: Peroneal longus tendon intact. Mild tendinosis of the
peroneus brevis with a longitudinal split tear of the level of the
lateral malleolus and mild tenosynovitis.

Posteromedial: Mild tendinosis of the posterior tibial tendon with
mild tenosynovitis. Flexor hallucis longus tendon intact. Flexor
digitorum longus tendon intact.

Anterior: Tibialis anterior tendon intact. Extensor hallucis longus
tendon intact Extensor digitorum longus tendon intact.

Achilles:  Intact.

Plantar Fascia: Intact. Plantar calcaneal spur.

LIGAMENTS

Lateral: Thickening of the anterior talofibular ligament likely
reflecting prior injury. Calcaneofibular ligament intact. Posterior
talofibular ligament intact. Anterior and posterior tibiofibular
ligaments intact.

Medial: Deltoid ligament intact. Spring ligament intact.

CARTILAGE

Ankle Joint: Moderate ankle joint effusion. 7 mm loose body in the
anterolateral ankle joint space. Partial-thickness cartilage loss
throughout the tibiotalar joint.

Subtalar Joints/Sinus Tarsi: Normal subtalar joints. No subtalar
joint effusion. Effacement of the normal sinus tarsi fat with
subcortical reactive marrow changes in the talus and calcaneus.

Bones: Hindfoot valgus. Reactive marrow changes in the distal tip of
the fibula at the articulation with the calcaneus as can be seen
with sub fibular impingement. Moderate osteoarthritis of the second
tarsometatarsal joint. Mild osteoarthritis of the first TMT joint.
Mild osteoarthritis of the talonavicular joint. Severe
osteoarthritis of the fourth tarsometatarsal joint.

Soft Tissue: No fluid collection or hematoma. Mild muscle atrophy of
the flexor hallucis muscle. Tarsal tunnel is normal.
IMPRESSION: 1. Mild tendinosis of the peroneus brevis with a longitudinal split
tear of the level of the lateral malleolus and mild tenosynovitis.
2. Mild tendinosis of the posterior tibial tendon with mild
tenosynovitis.
3. Hindfoot valgus. Reactive marrow changes in the distal tip of the
fibula at the articulation with the calcaneus as can be seen with
sub fibular impingement.
4. Mild osteoarthritis of the first TMT joint.
5. Severe osteoarthritis of the fourth tarsometatarsal joint.

## 2023-04-03 ENCOUNTER — Ambulatory Visit (INDEPENDENT_AMBULATORY_CARE_PROVIDER_SITE_OTHER): Payer: 59

## 2023-04-03 DIAGNOSIS — I25118 Atherosclerotic heart disease of native coronary artery with other forms of angina pectoris: Secondary | ICD-10-CM | POA: Diagnosis not present

## 2023-04-03 MED ORDER — TECHNETIUM TC 99M SESTAMIBI GENERIC - CARDIOLITE
32.6000 | Freq: Once | INTRAVENOUS | Status: AC | PRN
  Administered 2023-04-03: 32.6 via INTRAVENOUS

## 2023-04-03 MED ORDER — TECHNETIUM TC 99M SESTAMIBI GENERIC - CARDIOLITE
11.0000 | Freq: Once | INTRAVENOUS | Status: AC | PRN
  Administered 2023-04-03: 11 via INTRAVENOUS

## 2023-04-06 ENCOUNTER — Ambulatory Visit: Payer: 59 | Admitting: Sports Medicine

## 2023-04-06 VITALS — BP 138/80 | Ht 72.0 in | Wt 230.0 lb

## 2023-04-06 DIAGNOSIS — M216X2 Other acquired deformities of left foot: Secondary | ICD-10-CM

## 2023-04-06 DIAGNOSIS — R269 Unspecified abnormalities of gait and mobility: Secondary | ICD-10-CM

## 2023-04-06 NOTE — Assessment & Plan Note (Signed)
For custom orthotics today:  Patient was fitted for a : standard, cushioned, semi-rigid orthotic. The orthotic was heated and afterward the patient stood on the orthotic blank positioned on the orthotic stand. The patient was positioned in subtalar neutral position and 10 degrees of ankle dorsiflexion in a weight bearing stance. After completion of molding, a stable base was applied to the orthotic blank. The blank was ground to a stable position for weight bearing. Size: 14 cushioned EVA Base: Medium density EVA Posting:  left heel wedge Additional orthotic padding: Left large scaphoid pad  At conclusion patient could walk more comfortably with less pronation and less limping  Test these over next month and we will modify if needed after that period.

## 2023-04-06 NOTE — Progress Notes (Signed)
PCP: Georgianne Fick, MD  Subjective:   HPI: Patient is a 72 y.o. male here for evaluation of orthotics.  Patient had total ankle replacement approximately 1 year ago with [facility].  He has limited ROM of left ankle.  Significant neuropathy of left foot, per patient and spouse they once found thumbtack in patient's foot and he was unable to feel it.  Their primary concern is a swelling that has developed on the lateral plantar side of plantar leftfoot, despite neuropathy this is painful at times and growing.  Patient is using orthotics that he got from [facility].  He is interested in custom orthotics today.  Past Medical History:  Diagnosis Date   Hyperglycemia due to type 2 diabetes mellitus (HCC) 02/10/2021    Current Outpatient Medications on File Prior to Visit  Medication Sig Dispense Refill   aspirin EC 81 MG tablet Take 81 mg by mouth daily. Swallow whole.     atorvastatin (LIPITOR) 80 MG tablet Take 80 mg by mouth at bedtime.     clotrimazole (LOTRIMIN) 1 % cream Apply topically 2 (two) times daily as needed.     dicyclomine (BENTYL) 10 MG capsule Take 10 mg by mouth 4 (four) times daily -  before meals and at bedtime.     gabapentin (NEURONTIN) 100 MG capsule Take 100 mg by mouth 3 (three) times daily.     isosorbide mononitrate (IMDUR) 30 MG 24 hr tablet TAKE 1 TABLET BY MOUTH EVERY MORNING 30 tablet 2   JARDIANCE 10 MG TABS tablet Take 10 mg by mouth daily.     losartan (COZAAR) 25 MG tablet Take 25 mg by mouth daily.     metFORMIN (GLUCOPHAGE) 1000 MG tablet Take 1,000 mg by mouth 2 (two) times daily.     metoprolol succinate (TOPROL-XL) 25 MG 24 hr tablet TAKE ONE TABLET BY MOUTH ONE TIME DAILY 90 tablet 1   NOVOLOG 100 UNIT/ML injection Inject into the skin.     omeprazole (PRILOSEC) 40 MG capsule Take 40 mg by mouth daily.     PRESCRIPTION MEDICATION Domperidone 10mg  TID     traZODone (DESYREL) 100 MG tablet Take 100 mg by mouth at bedtime.     No current  facility-administered medications on file prior to visit.    No past surgical history on file.  No Known Allergies  BP 138/80   Ht 6' (1.829 m)   Wt 230 lb (104.3 kg)   BMI 31.19 kg/m       No data to display              No data to display              Objective:  Physical Exam:  Gen: NAD, comfortable in exam room  Left ankle shows significant chronic post surgical changes.  The longitudinal arch has dropped and the midfoot is shifted medially. The ankle shows no active or passive dorsiflexion past neutral Foot shows flattening of both longitudinal and transverse arch On plantar surface laterally there is a soft tissue mass that appears to be retracted quadratus plantae muscle  Gait shows excess pronation on left and trendelenberg shift medial to lateral   Assessment & Plan:  1.  Planovalgus deformity of left foot:  2. Gait abnormality  (See problem list)  I observed and examined the patient with the resident and agree with assessment and plan.  Note reviewed,modified and completed by me.  Sterling Big, MD

## 2023-04-06 NOTE — Assessment & Plan Note (Signed)
He needs not only custom orthotic correction to cushion foot better but additional scaphoid padding and heel wedging to block pronation

## 2023-04-07 ENCOUNTER — Ambulatory Visit: Payer: 59

## 2023-04-07 DIAGNOSIS — I361 Nonrheumatic tricuspid (valve) insufficiency: Secondary | ICD-10-CM | POA: Diagnosis not present

## 2023-04-07 DIAGNOSIS — I10 Essential (primary) hypertension: Secondary | ICD-10-CM

## 2023-04-07 DIAGNOSIS — I351 Nonrheumatic aortic (valve) insufficiency: Secondary | ICD-10-CM | POA: Diagnosis not present

## 2023-04-20 ENCOUNTER — Encounter: Payer: Self-pay | Admitting: Cardiovascular Disease

## 2023-04-20 ENCOUNTER — Ambulatory Visit (INDEPENDENT_AMBULATORY_CARE_PROVIDER_SITE_OTHER): Payer: 59 | Admitting: Cardiovascular Disease

## 2023-04-20 VITALS — BP 130/65 | HR 87 | Ht 72.0 in | Wt 234.8 lb

## 2023-04-20 DIAGNOSIS — I25118 Atherosclerotic heart disease of native coronary artery with other forms of angina pectoris: Secondary | ICD-10-CM | POA: Diagnosis not present

## 2023-04-20 DIAGNOSIS — E1142 Type 2 diabetes mellitus with diabetic polyneuropathy: Secondary | ICD-10-CM

## 2023-04-20 DIAGNOSIS — I10 Essential (primary) hypertension: Secondary | ICD-10-CM | POA: Diagnosis not present

## 2023-04-20 DIAGNOSIS — Z794 Long term (current) use of insulin: Secondary | ICD-10-CM

## 2023-04-20 DIAGNOSIS — E782 Mixed hyperlipidemia: Secondary | ICD-10-CM

## 2023-04-20 MED ORDER — SPIRONOLACTONE 25 MG PO TABS: 25.0000 mg | ORAL_TABLET | Freq: Every day | ORAL | 11 refills | Status: DC

## 2023-04-20 NOTE — Progress Notes (Signed)
Cardiology Office Note   Date:  04/20/2023   ID:  Collin [name], DOB 01-Oct-1950, MRN [id number]  PCP:  Georgianne Fick, MD  Cardiologist:  Adrian Blackwater, MD      History of Present Illness: Collin [name] is a 72 y.o. male who presents for  Chief Complaint  Patient presents with   Follow-up    ECHO & NST RESULTS    Feeling fine      Past Medical History:  Diagnosis Date   Hyperglycemia due to type 2 diabetes mellitus (HCC) 02/10/2021     No past surgical history on file.   Current Outpatient Medications  Medication Sig Dispense Refill   aspirin EC 81 MG tablet Take 81 mg by mouth daily. Swallow whole.     atorvastatin (LIPITOR) 80 MG tablet Take 80 mg by mouth at bedtime.     clotrimazole (LOTRIMIN) 1 % cream Apply topically 2 (two) times daily as needed.     dicyclomine (BENTYL) 10 MG capsule Take 10 mg by mouth 4 (four) times daily -  before meals and at bedtime.     gabapentin (NEURONTIN) 100 MG capsule Take 100 mg by mouth 3 (three) times daily.     isosorbide mononitrate (IMDUR) 30 MG 24 hr tablet TAKE 1 TABLET BY MOUTH EVERY MORNING 30 tablet 2   JARDIANCE 10 MG TABS tablet Take 10 mg by mouth daily.     losartan (COZAAR) 25 MG tablet Take 25 mg by mouth daily.     metFORMIN (GLUCOPHAGE) 1000 MG tablet Take 1,000 mg by mouth 2 (two) times daily.     metoprolol succinate (TOPROL-XL) 25 MG 24 hr tablet TAKE ONE TABLET BY MOUTH ONE TIME DAILY 90 tablet 1   NOVOLOG 100 UNIT/ML injection Inject into the skin.     omeprazole (PRILOSEC) 40 MG capsule Take 40 mg by mouth daily.     PRESCRIPTION MEDICATION Domperidone 10mg  TID     spironolactone (ALDACTONE) 25 MG tablet Take 1 tablet (25 mg total) by mouth daily. 30 tablet 11   traZODone (DESYREL) 100 MG tablet Take 100 mg by mouth at bedtime.     No current facility-administered medications for this visit.    Allergies:   Patient has no known allergies.    Social History:   reports that he has never  smoked. He has never used smokeless tobacco. He reports that he does not currently use alcohol. No history on file for drug use.   Family History:  family history is not on file.    ROS:     ROS    All other systems are reviewed and negative.    PHYSICAL EXAM: VS:  BP 130/65   Pulse 87   Ht 6' (1.829 m)   Wt 234 lb 12.8 oz (106.5 kg)   SpO2 98%   BMI 31.84 kg/m  , BMI Body mass index is 31.84 kg/m. Last weight:  Wt Readings from Last 3 Encounters:  04/20/23 234 lb 12.8 oz (106.5 kg)  04/06/23 230 lb (104.3 kg)  03/21/23 231 lb (104.8 kg)     Physical Exam    EKG:   Recent Labs: No results found for requested labs within last 365 days.    Lipid Panel No results found for: "CHOL", "TRIG", "HDL", "CHOLHDL", "VLDL", "LDLCALC", "LDLDIRECT"    Other studies Reviewed: Additional studies/ records that were reviewed today include:  Review of the above records demonstrates:       No data to display  ASSESSMENT AND PLAN:    ICD-10-CM   1. Atherosclerotic heart disease of native coronary artery with other forms of angina pectoris (HCC)  I25.118 spironolactone (ALDACTONE) 25 MG tablet    Basic metabolic panel   stress test normal, has diastolic dysfunction, add aldactone as has tiredness, sleep study negative 2 years ago.    2. Essential hypertension  I10 spironolactone (ALDACTONE) 25 MG tablet    Basic metabolic panel    3. Type 2 diabetes mellitus with diabetic polyneuropathy, with long-term current use of insulin (HCC)  E11.42 spironolactone (ALDACTONE) 25 MG tablet   Z79.4 Basic metabolic panel    4. Mixed hyperlipidemia  E78.2 spironolactone (ALDACTONE) 25 MG tablet    Basic metabolic panel       Problem List Items Addressed This Visit       Cardiovascular and Mediastinum   Atherosclerotic heart disease of native coronary artery with other forms of angina pectoris (HCC) - Primary   Relevant Medications   spironolactone (ALDACTONE) 25 MG  tablet   Other Relevant Orders   Basic metabolic panel   Essential hypertension   Relevant Medications   spironolactone (ALDACTONE) 25 MG tablet   Other Relevant Orders   Basic metabolic panel     Endocrine   Diabetes mellitus (HCC)   Relevant Medications   spironolactone (ALDACTONE) 25 MG tablet   Other Relevant Orders   Basic metabolic panel     Other   Hyperlipidemia   Relevant Medications   spironolactone (ALDACTONE) 25 MG tablet   Other Relevant Orders   Basic metabolic panel       Disposition:   No follow-ups on file.    Total time spent: 30 minutes  Signed,  Adrian Blackwater, MD  04/20/2023 4:09 PM    [facility]

## 2023-04-27 ENCOUNTER — Telehealth: Payer: Self-pay | Admitting: Internal Medicine

## 2023-04-27 NOTE — Telephone Encounter (Signed)
Patient's wife left VM asking for a call back regarding labs that the patient was having checked by [facility].

## 2023-04-27 NOTE — Telephone Encounter (Signed)
Spoke with pt who informed that pt is just now starting medication the [facility] proscribed at last visit and wanted to know if pt should keep appointment or reschedule. Wife also stated that pt had lab work done too early and wanted to know what to do. Pt wife was advised to reschedule appt so that pt is on Rx for 2 weeks before being seen along with contacting office 2 days before appt so that labs can be put in for him to complete labs here at office; wife verbalized understanding.

## 2023-05-08 ENCOUNTER — Ambulatory Visit: Payer: 59 | Admitting: Cardiovascular Disease

## 2023-05-10 ENCOUNTER — Other Ambulatory Visit: Payer: Self-pay | Admitting: Cardiovascular Disease

## 2023-05-10 DIAGNOSIS — I25118 Atherosclerotic heart disease of native coronary artery with other forms of angina pectoris: Secondary | ICD-10-CM

## 2023-05-16 ENCOUNTER — Other Ambulatory Visit: Payer: 59

## 2023-05-17 LAB — BASIC METABOLIC PANEL
BUN/Creatinine Ratio: 16 (ref 10–24)
BUN: 22 mg/dL (ref 8–27)
CO2: 21 mmol/L (ref 20–29)
Calcium: 9.5 mg/dL (ref 8.6–10.2)
Chloride: 100 mmol/L (ref 96–106)
Creatinine, Ser: 1.34 mg/dL — ABNORMAL HIGH (ref 0.76–1.27)
Glucose: 123 mg/dL — ABNORMAL HIGH (ref 70–99)
Potassium: 4.8 mmol/L (ref 3.5–5.2)
Sodium: 137 mmol/L (ref 134–144)
eGFR: 57 mL/min/{1.73_m2} — ABNORMAL LOW (ref >59)

## 2023-05-18 ENCOUNTER — Encounter: Payer: Self-pay | Admitting: Cardiovascular Disease

## 2023-05-18 ENCOUNTER — Ambulatory Visit (INDEPENDENT_AMBULATORY_CARE_PROVIDER_SITE_OTHER): Payer: 59 | Admitting: Cardiovascular Disease

## 2023-05-18 VITALS — BP 120/65 | HR 89 | Ht 72.0 in | Wt 230.2 lb

## 2023-05-18 DIAGNOSIS — Z794 Long term (current) use of insulin: Secondary | ICD-10-CM

## 2023-05-18 DIAGNOSIS — I10 Essential (primary) hypertension: Secondary | ICD-10-CM

## 2023-05-18 DIAGNOSIS — I25118 Atherosclerotic heart disease of native coronary artery with other forms of angina pectoris: Secondary | ICD-10-CM | POA: Diagnosis not present

## 2023-05-18 DIAGNOSIS — E1142 Type 2 diabetes mellitus with diabetic polyneuropathy: Secondary | ICD-10-CM | POA: Diagnosis not present

## 2023-05-18 DIAGNOSIS — E782 Mixed hyperlipidemia: Secondary | ICD-10-CM | POA: Diagnosis not present

## 2023-05-18 MED ORDER — ROSUVASTATIN CALCIUM 40 MG PO TABS: 40.0000 mg | ORAL_TABLET | Freq: Every day | ORAL | 2 refills | Status: DC

## 2023-05-18 NOTE — Progress Notes (Signed)
Cardiology Office Note   Date:  05/18/2023   ID:  Jalen Goring, DOB 11-16-1950, MRN [id number]  PCP:  Georgianne Fick, MD  Cardiologist:  Adrian Blackwater, MD      History of Present Illness: Collin [name] is a 72 y.o. male who presents for  Chief Complaint  Patient presents with   Follow-up    2 week f/u    Doing well      Past Medical History:  Diagnosis Date   Hyperglycemia due to type 2 diabetes mellitus (HCC) 02/10/2021     History reviewed. No pertinent surgical history.   Current Outpatient Medications  Medication Sig Dispense Refill   rosuvastatin (CRESTOR) 40 MG tablet Take 1 tablet (40 mg total) by mouth daily. 30 tablet 2   aspirin EC 81 MG tablet Take 81 mg by mouth daily. Swallow whole.     clotrimazole (LOTRIMIN) 1 % cream Apply topically 2 (two) times daily as needed.     dicyclomine (BENTYL) 10 MG capsule Take 10 mg by mouth 4 (four) times daily -  before meals and at bedtime.     gabapentin (NEURONTIN) 100 MG capsule Take 100 mg by mouth 3 (three) times daily.     isosorbide mononitrate (IMDUR) 30 MG 24 hr tablet TAKE 1 TABLET BY MOUTH EVERY MORNING 90 tablet 3   JARDIANCE 10 MG TABS tablet Take 10 mg by mouth daily.     losartan (COZAAR) 25 MG tablet Take 25 mg by mouth daily.     metFORMIN (GLUCOPHAGE) 1000 MG tablet Take 1,000 mg by mouth 2 (two) times daily.     metoprolol succinate (TOPROL-XL) 25 MG 24 hr tablet TAKE 1 TABLET BY MOUTH DAILY 90 tablet 1   NOVOLOG 100 UNIT/ML injection Inject into the skin.     omeprazole (PRILOSEC) 40 MG capsule Take 40 mg by mouth daily.     PRESCRIPTION MEDICATION Domperidone 10mg  TID     spironolactone (ALDACTONE) 25 MG tablet Take 1 tablet (25 mg total) by mouth daily. 30 tablet 11   traZODone (DESYREL) 100 MG tablet Take 100 mg by mouth at bedtime.     No current facility-administered medications for this visit.    Allergies:   Patient has no known allergies.    Social History:   reports that he  has never smoked. He has never used smokeless tobacco. He reports that he does not currently use alcohol. No history on file for drug use.   Family History:  family history is not on file.    ROS:     Review of Systems  Constitutional: Negative.   HENT: Negative.    Eyes: Negative.   Respiratory: Negative.    Gastrointestinal: Negative.   Genitourinary: Negative.   Musculoskeletal: Negative.   Skin: Negative.   Neurological: Negative.   Endo/Heme/Allergies: Negative.   Psychiatric/Behavioral: Negative.    All other systems reviewed and are negative.     All other systems are reviewed and negative.    PHYSICAL EXAM: VS:  BP 120/65   Pulse 89   Ht 6' (1.829 m)   Wt 230 lb 3.2 oz (104.4 kg)   SpO2 96%   BMI 31.22 kg/m  , BMI Body mass index is 31.22 kg/m. Last weight:  Wt Readings from Last 3 Encounters:  05/18/23 230 lb 3.2 oz (104.4 kg)  04/20/23 234 lb 12.8 oz (106.5 kg)  04/06/23 230 lb (104.3 kg)     Physical Exam Vitals reviewed.  Constitutional:  Appearance: Normal appearance. He is normal weight.  HENT:     Head: Normocephalic.     Nose: Nose normal.     Mouth/Throat:     Mouth: Mucous membranes are moist.  Eyes:     Pupils: Pupils are equal, round, and reactive to light.  Cardiovascular:     Rate and Rhythm: Normal rate and regular rhythm.     Pulses: Normal pulses.     Heart sounds: Normal heart sounds.  Pulmonary:     Effort: Pulmonary effort is normal.  Abdominal:     General: Abdomen is flat. Bowel sounds are normal.  Musculoskeletal:        General: Normal range of motion.     Cervical back: Normal range of motion.  Skin:    General: Skin is warm.  Neurological:     General: No focal deficit present.     Mental Status: He is alert.  Psychiatric:        Mood and Affect: Mood normal.       EKG:   Recent Labs: 05/16/2023: BUN 22; Creatinine, Ser 1.34; Potassium 4.8; Sodium 137    Lipid Panel No results found for: "CHOL",  "TRIG", "HDL", "CHOLHDL", "VLDL", "LDLCALC", "LDLDIRECT"    Other studies Reviewed: Additional studies/ records that were reviewed today include:  Review of the above records demonstrates:       No data to display            ASSESSMENT AND PLAN:    ICD-10-CM   1. Essential hypertension  I10 rosuvastatin (CRESTOR) 40 MG tablet   Doing well on aldactone 25 daily, doing well, no swelling of legs and SOB, and already on jaurdiance as may have HEpEF    2. Atherosclerotic heart disease of native coronary artery with other forms of angina pectoris (HCC)  I25.118 rosuvastatin (CRESTOR) 40 MG tablet   h/o pci/stenting aug 2018, stress test normal, echo normal EF    3. Mixed hyperlipidemia  E78.2 rosuvastatin (CRESTOR) 40 MG tablet   LPa 175, a bit high, but had PCI/stenting so as expected, on high intensity statins 80 lipitor, with LDL 45, but trig 300, HDL 26, thus crestor 40 better    4. Type 2 diabetes mellitus with diabetic polyneuropathy, with long-term current use of insulin (HCC)  E11.42 rosuvastatin (CRESTOR) 40 MG tablet   Z79.4        Problem List Items Addressed This Visit       Cardiovascular and Mediastinum   Atherosclerotic heart disease of native coronary artery with other forms of angina pectoris (HCC)   Relevant Medications   rosuvastatin (CRESTOR) 40 MG tablet   Essential hypertension - Primary   Relevant Medications   rosuvastatin (CRESTOR) 40 MG tablet     Endocrine   Diabetes mellitus (HCC)   Relevant Medications   rosuvastatin (CRESTOR) 40 MG tablet     Other   Hyperlipidemia   Relevant Medications   rosuvastatin (CRESTOR) 40 MG tablet       Disposition:   Return in about 3 months (around 08/17/2023).    Total time spent: 30 minutes  Signed,  Adrian Blackwater, MD  05/18/2023 4:19 PM    [facility]

## 2023-06-09 ENCOUNTER — Other Ambulatory Visit: Payer: Self-pay | Admitting: Cardiovascular Disease

## 2023-06-12 ENCOUNTER — Ambulatory Visit
Admission: RE | Admit: 2023-06-12 | Discharge: 2023-06-12 | Disposition: A | Discharge status: Home / Self Care | Payer: No Typology Code available for payment source | Source: Ambulatory Visit | Attending: Nurse Practitioner | Admitting: Nurse Practitioner

## 2023-06-12 ENCOUNTER — Other Ambulatory Visit: Payer: Self-pay | Admitting: Nurse Practitioner

## 2023-06-12 DIAGNOSIS — M25532 Pain in left wrist: Secondary | ICD-10-CM

## 2023-06-12 DIAGNOSIS — R52 Pain, unspecified: Secondary | ICD-10-CM

## 2023-06-12 DIAGNOSIS — R0781 Pleurodynia: Secondary | ICD-10-CM

## 2023-08-14 ENCOUNTER — Other Ambulatory Visit: Payer: Self-pay | Admitting: Cardiovascular Disease

## 2023-08-14 DIAGNOSIS — I10 Essential (primary) hypertension: Secondary | ICD-10-CM

## 2023-08-14 DIAGNOSIS — E1142 Type 2 diabetes mellitus with diabetic polyneuropathy: Secondary | ICD-10-CM

## 2023-08-14 DIAGNOSIS — E782 Mixed hyperlipidemia: Secondary | ICD-10-CM

## 2023-08-14 DIAGNOSIS — I25118 Atherosclerotic heart disease of native coronary artery with other forms of angina pectoris: Secondary | ICD-10-CM

## 2023-08-28 ENCOUNTER — Ambulatory Visit: Payer: 59 | Admitting: Cardiovascular Disease

## 2023-10-09 ENCOUNTER — Ambulatory Visit: Payer: 59 | Admitting: Cardiovascular Disease

## 2023-10-26 ENCOUNTER — Ambulatory Visit: Payer: 59 | Admitting: Cardiovascular Disease

## 2023-11-07 ENCOUNTER — Other Ambulatory Visit: Payer: Self-pay | Admitting: Cardiovascular Disease

## 2023-11-07 DIAGNOSIS — I25118 Atherosclerotic heart disease of native coronary artery with other forms of angina pectoris: Secondary | ICD-10-CM

## 2023-11-10 ENCOUNTER — Other Ambulatory Visit: Payer: Self-pay | Admitting: Cardiovascular Disease

## 2023-11-10 DIAGNOSIS — I25118 Atherosclerotic heart disease of native coronary artery with other forms of angina pectoris: Secondary | ICD-10-CM

## 2023-11-10 DIAGNOSIS — E782 Mixed hyperlipidemia: Secondary | ICD-10-CM

## 2023-11-10 DIAGNOSIS — I10 Essential (primary) hypertension: Secondary | ICD-10-CM

## 2023-11-10 DIAGNOSIS — Z794 Long term (current) use of insulin: Secondary | ICD-10-CM

## 2023-11-16 ENCOUNTER — Encounter: Payer: Self-pay | Admitting: Cardiovascular Disease

## 2023-11-16 ENCOUNTER — Ambulatory Visit: Admitting: Cardiovascular Disease

## 2023-11-16 VITALS — BP 138/72 | HR 84 | Ht 72.0 in | Wt 232.0 lb

## 2023-11-16 DIAGNOSIS — E782 Mixed hyperlipidemia: Secondary | ICD-10-CM | POA: Diagnosis not present

## 2023-11-16 DIAGNOSIS — I10 Essential (primary) hypertension: Secondary | ICD-10-CM

## 2023-11-16 DIAGNOSIS — I25118 Atherosclerotic heart disease of native coronary artery with other forms of angina pectoris: Secondary | ICD-10-CM | POA: Diagnosis not present

## 2023-11-16 DIAGNOSIS — E1142 Type 2 diabetes mellitus with diabetic polyneuropathy: Secondary | ICD-10-CM

## 2023-11-16 DIAGNOSIS — Z794 Long term (current) use of insulin: Secondary | ICD-10-CM

## 2023-11-16 DIAGNOSIS — I2510 Atherosclerotic heart disease of native coronary artery without angina pectoris: Secondary | ICD-10-CM

## 2023-11-16 DIAGNOSIS — I5032 Chronic diastolic (congestive) heart failure: Secondary | ICD-10-CM

## 2023-11-16 DIAGNOSIS — Z9861 Coronary angioplasty status: Secondary | ICD-10-CM

## 2023-11-16 NOTE — Progress Notes (Addendum)
 Cardiology Office Note   Date:  11/16/2023   ID:  Collin [name], DOB 17-Mar-1951, MRN [id number]  PCP:  Collin Fick, MD  Cardiologist:  Collin Blackwater, MD      History of Present Illness: Collin [name] is a 73 y.o. male who presents for  Chief Complaint  Patient presents with   Follow-up    3 month follow up    Doing good, no shortness of breath or chest pain, but has DOE.      Past Medical History:  Diagnosis Date   Hyperglycemia due to type 2 diabetes mellitus (HCC) 02/10/2021     History reviewed. No pertinent surgical history.   Current Outpatient Medications  Medication Sig Dispense Refill   aspirin EC 81 MG tablet Take 81 mg by mouth daily. Swallow whole.     clotrimazole (LOTRIMIN) 1 % cream Apply topically 2 (two) times daily as needed.     dicyclomine (BENTYL) 10 MG capsule Take 10 mg by mouth 4 (four) times daily -  before meals and at bedtime.     gabapentin (NEURONTIN) 100 MG capsule Take 100 mg by mouth 3 (three) times daily.     isosorbide mononitrate (IMDUR) 30 MG 24 hr tablet TAKE 1 TABLET BY MOUTH EVERY MORNING 90 tablet 3   JARDIANCE 10 MG TABS tablet Take 10 mg by mouth daily.     losartan (COZAAR) 25 MG tablet TAKE 1 TABLET BY MOUTH DAILY 120 tablet 3   metFORMIN (GLUCOPHAGE) 1000 MG tablet Take 1,000 mg by mouth 2 (two) times daily.     metoprolol succinate (TOPROL-XL) 25 MG 24 hr tablet TAKE 1 TABLET BY MOUTH DAILY 30 tablet 2   NOVOLOG 100 UNIT/ML injection Inject into the skin.     omeprazole (PRILOSEC) 40 MG capsule Take 40 mg by mouth daily.     PRESCRIPTION MEDICATION Domperidone 10mg  TID     rosuvastatin (CRESTOR) 40 MG tablet TAKE 1 TABLET BY MOUTH DAILY 30 tablet 2   spironolactone (ALDACTONE) 25 MG tablet Take 1 tablet (25 mg total) by mouth daily. 30 tablet 11   traZODone (DESYREL) 100 MG tablet Take 100 mg by mouth at bedtime.     No current facility-administered medications for this visit.    Allergies:   Patient has no  known allergies.    Social History:   reports that he has never smoked. He has never used smokeless tobacco. He reports that he does not currently use alcohol. No history on file for drug use.   Family History:  family history is not on file.    ROS:     Review of Systems  Constitutional: Negative.   HENT: Negative.    Eyes: Negative.   Respiratory: Negative.    Gastrointestinal: Negative.   Genitourinary: Negative.   Musculoskeletal: Negative.   Skin: Negative.   Neurological: Negative.   Endo/Heme/Allergies: Negative.   Psychiatric/Behavioral: Negative.    All other systems reviewed and are negative.     All other systems are reviewed and negative.    PHYSICAL EXAM: VS:  BP 138/72   Pulse 84   Ht 6' (1.829 m)   Wt 232 lb (105.2 kg)   SpO2 97%   BMI 31.46 kg/m  , BMI Body mass index is 31.46 kg/m. Last weight:  Wt Readings from Last 3 Encounters:  11/16/23 232 lb (105.2 kg)  05/18/23 230 lb 3.2 oz (104.4 kg)  04/20/23 234 lb 12.8 oz (106.5 kg)  Physical Exam Vitals reviewed.  Constitutional:      Appearance: Normal appearance. He is normal weight.  HENT:     Head: Normocephalic.     Nose: Nose normal.     Mouth/Throat:     Mouth: Mucous membranes are moist.  Eyes:     Pupils: Pupils are equal, round, and reactive to light.  Cardiovascular:     Rate and Rhythm: Normal rate and regular rhythm.     Pulses: Normal pulses.     Heart sounds: Normal heart sounds.  Pulmonary:     Effort: Pulmonary effort is normal.  Abdominal:     General: Abdomen is flat. Bowel sounds are normal.  Musculoskeletal:        General: Normal range of motion.     Cervical back: Normal range of motion.  Skin:    General: Skin is warm.  Neurological:     General: No focal deficit present.     Mental Status: He is alert.  Psychiatric:        Mood and Affect: Mood normal.       EKG:   Recent Labs: 05/16/2023: BUN 22; Creatinine, Ser 1.34; Potassium 4.8; Sodium 137     Lipid Panel No results found for: "CHOL", "TRIG", "HDL", "CHOLHDL", "VLDL", "LDLCALC", "LDLDIRECT"    Other studies Reviewed: Additional studies/ records that were reviewed today include:  Review of the above records demonstrates:       No data to display            ASSESSMENT AND PLAN:    ICD-10-CM   1. Mixed hyperlipidemia  E78.2     2. Essential hypertension  I10    repeat BP 140/70, but running normal in check up recently, just got off work.    3. Atherosclerotic heart disease of native coronary artery with other forms of angina pectoris (HCC)  I25.118     4. Type 2 diabetes mellitus with diabetic polyneuropathy, with long-term current use of insulin (HCC)  E11.42    Z79.4     5. Chronic heart failure with preserved ejection fraction (HCC)  I50.32    No SOB, on jaurdiance/aldactone    6. CAD S/P percutaneous coronary angioplasty  I25.10    Z98.61    2018 stents , since then no chest pain       Problem List Items Addressed This Visit       Cardiovascular and Mediastinum   Atherosclerotic heart disease of native coronary artery with other forms of angina pectoris (HCC)   Essential hypertension     Endocrine   Diabetes mellitus (HCC)     Other   Hyperlipidemia - Primary   Other Visit Diagnoses       Chronic heart failure with preserved ejection fraction (HCC)       No SOB, on jaurdiance/aldactone     CAD S/P percutaneous coronary angioplasty       2018 stents , since then no chest pain          Disposition:   Return in about 3 months (around 02/16/2024).    Total time spent: 30 minutes  Signed,  Collin Blackwater, MD  11/16/2023 3:57 PM    [facility]

## 2023-12-05 ENCOUNTER — Encounter: Payer: Self-pay | Admitting: Sports Medicine

## 2023-12-05 ENCOUNTER — Ambulatory Visit: Admitting: Sports Medicine

## 2023-12-05 VITALS — BP 134/62 | Ht 72.0 in | Wt 230.0 lb

## 2023-12-05 DIAGNOSIS — M216X2 Other acquired deformities of left foot: Secondary | ICD-10-CM | POA: Diagnosis not present

## 2023-12-05 NOTE — Assessment & Plan Note (Addendum)
 Jim's previous orthotic base was reglued to his previous set He has been doing very well with his orthotics, however his previous surgery with subsequent changes have left him with a painful muscle belly beneath his fifth metatarsal I suggested follow-up with his previous surgeon at [facility] for possible intervention We made him a new set of orthotics as below.  Patient was fitted for a fit and run, cushioned, semi-rigid orthotic.  The orthotic was heated and the patient stood on the orthotic blank positioned on the orthotic stand. The patient was positioned in subtalar neutral position and 10 degrees of ankle dorsiflexion in a weight bearing stance. After molding, a stable [employer] EVA base was applied to the orthotic blank.   The blank was ground to a stable position for weight bearing. size: 14 base: Blue EVA  posting: none additional orthotic padding: Large scaphoid on the foot with a medial heel wedge Orthotics fit well and patients gait: Neutral with no flexion of the left ankle

## 2023-12-05 NOTE — Progress Notes (Addendum)
 Collin [name] - 73 y.o. male MRN [id number]  Date of birth: Sep 18, 1950  PCP: Georgianne Fick, MD  Subjective:  No chief complaint on file. New orthotics  HPI: Past Medical, Surgical, Social, and Family History Reviewed & Updated per EMR.   Patient is a 73 y.o. male here for repair of his previous orthotics which have been working very well.  He would also like a new set.  With his previous ankle reconstruction and chronic pain in the feet he has difficulty standing for any period of time and carries a portable seat with him at work.  He is also been taking gabapentin for his neuropathic pain which does help him some.  Past Medical History:  Diagnosis Date   Hyperglycemia due to type 2 diabetes mellitus (HCC) 02/10/2021    Current Outpatient Medications on File Prior to Visit  Medication Sig Dispense Refill   aspirin EC 81 MG tablet Take 81 mg by mouth daily. Swallow whole.     clotrimazole (LOTRIMIN) 1 % cream Apply topically 2 (two) times daily as needed.     dicyclomine (BENTYL) 10 MG capsule Take 10 mg by mouth 4 (four) times daily -  before meals and at bedtime.     gabapentin (NEURONTIN) 100 MG capsule Take 100 mg by mouth 3 (three) times daily.     isosorbide mononitrate (IMDUR) 30 MG 24 hr tablet TAKE 1 TABLET BY MOUTH EVERY MORNING 90 tablet 3   JARDIANCE 10 MG TABS tablet Take 10 mg by mouth daily.     losartan (COZAAR) 25 MG tablet TAKE 1 TABLET BY MOUTH DAILY 120 tablet 3   metFORMIN (GLUCOPHAGE) 1000 MG tablet Take 1,000 mg by mouth 2 (two) times daily.     metoprolol succinate (TOPROL-XL) 25 MG 24 hr tablet TAKE 1 TABLET BY MOUTH DAILY 30 tablet 2   NOVOLOG 100 UNIT/ML injection Inject into the skin.     omeprazole (PRILOSEC) 40 MG capsule Take 40 mg by mouth daily.     PRESCRIPTION MEDICATION Domperidone 10mg  TID     rosuvastatin (CRESTOR) 40 MG tablet TAKE 1 TABLET BY MOUTH DAILY 30 tablet 2   spironolactone (ALDACTONE) 25 MG tablet Take 1 tablet (25 mg total) by  mouth daily. 30 tablet 11   traZODone (DESYREL) 100 MG tablet Take 100 mg by mouth at bedtime.     No current facility-administered medications on file prior to visit.    History reviewed. No pertinent surgical history.  No Known Allergies      Objective:  Physical Exam: VS: BP:134/62  HR: bpm  TEMP: ( )  RESP:   HT:6' (182.9 cm)   WT:230 lb (104.3 kg)  BMI:31.19  Gen: NAD, speaks clearly, comfortable in exam room Respiratory: Normal respiratory effort on room air. No signs of distress Skin: No rashes, abrasions, or ecchymosis MSK:  Inspection of the feet shows a well-healed vertical surgical scar over the dorsal aspect of the left midfoot and ankle joint There is ankle and midfoot valgus formation left greater than right There is near-total collapse of the left longitudinal arch and increased loading on the fifth metatarsal head There is palpable muscle belly inferior to the proximal fifth metatarsal head that is slightly tender to palpation There is virtually no flexion at the left ankle with ambulation   Assessment & Plan:   Planovalgus deformity of foot, acquired, left Jim's previous orthotic base was reglued to his previous set He has been doing very well with his orthotics, however  his previous surgery with subsequent changes have left him with a painful muscle belly beneath his fifth metatarsal I suggested follow-up with his previous surgeon at [facility] for possible intervention We made him a new set of orthotics as below.  Patient was fitted for a fit and run, cushioned, semi-rigid orthotic.  The orthotic was heated and the patient stood on the orthotic blank positioned on the orthotic stand. The patient was positioned in subtalar neutral position and 10 degrees of ankle dorsiflexion in a weight bearing stance. After molding, a stable [employer] EVA base was applied to the orthotic blank.   The blank was ground to a stable position for weight bearing. size: 14 base: Blue  EVA  posting: none additional orthotic padding: Large scaphoid on the foot with a medial heel wedge Orthotics fit well and patients gait: Neutral with no flexion of the left ankle     Berneda Bridges MD [facility] Sports Medicine Fellow  I observed and examined the patient with the [facility] resident and agree with assessment and plan.  Note reviewed and modified by me. Zell Hicks, MD

## 2024-02-04 ENCOUNTER — Other Ambulatory Visit: Payer: Self-pay | Admitting: Cardiovascular Disease

## 2024-02-04 DIAGNOSIS — I25118 Atherosclerotic heart disease of native coronary artery with other forms of angina pectoris: Secondary | ICD-10-CM

## 2024-02-08 ENCOUNTER — Other Ambulatory Visit: Payer: Self-pay | Admitting: Cardiovascular Disease

## 2024-02-08 DIAGNOSIS — I10 Essential (primary) hypertension: Secondary | ICD-10-CM

## 2024-02-08 DIAGNOSIS — E1142 Type 2 diabetes mellitus with diabetic polyneuropathy: Secondary | ICD-10-CM

## 2024-02-08 DIAGNOSIS — I25118 Atherosclerotic heart disease of native coronary artery with other forms of angina pectoris: Secondary | ICD-10-CM

## 2024-02-08 DIAGNOSIS — E782 Mixed hyperlipidemia: Secondary | ICD-10-CM

## 2024-02-16 ENCOUNTER — Ambulatory Visit: Admitting: Cardiovascular Disease

## 2024-03-07 ENCOUNTER — Ambulatory Visit: Admitting: Cardiovascular Disease

## 2024-03-07 ENCOUNTER — Encounter: Payer: Self-pay | Admitting: Cardiovascular Disease

## 2024-03-07 VITALS — BP 118/73 | HR 94 | Ht 72.0 in | Wt 231.4 lb

## 2024-03-07 DIAGNOSIS — Z794 Long term (current) use of insulin: Secondary | ICD-10-CM

## 2024-03-07 DIAGNOSIS — I25118 Atherosclerotic heart disease of native coronary artery with other forms of angina pectoris: Secondary | ICD-10-CM | POA: Diagnosis not present

## 2024-03-07 DIAGNOSIS — E782 Mixed hyperlipidemia: Secondary | ICD-10-CM

## 2024-03-07 DIAGNOSIS — I10 Essential (primary) hypertension: Secondary | ICD-10-CM | POA: Diagnosis not present

## 2024-03-07 DIAGNOSIS — E1142 Type 2 diabetes mellitus with diabetic polyneuropathy: Secondary | ICD-10-CM | POA: Diagnosis not present

## 2024-03-07 DIAGNOSIS — I2510 Atherosclerotic heart disease of native coronary artery without angina pectoris: Secondary | ICD-10-CM

## 2024-03-07 DIAGNOSIS — R0602 Shortness of breath: Secondary | ICD-10-CM

## 2024-03-07 DIAGNOSIS — Z9861 Coronary angioplasty status: Secondary | ICD-10-CM

## 2024-03-07 NOTE — Progress Notes (Signed)
 Cardiology Office Note   Date:  03/07/2024   ID:  Collin [name], DOB 11-03-1950, MRN [id number]  PCP:  Verdia Lombard, MD  Cardiologist:  Denyse Bathe, MD      History of Present Illness: Collin [name] is a 73 y.o. male who presents for  Chief Complaint  Patient presents with   Follow-up    Has SOB on exertion, no chest pain. No stress test for 3-4 years.      Past Medical History:  Diagnosis Date   Hyperglycemia due to type 2 diabetes mellitus (HCC) 02/10/2021     History reviewed. No pertinent surgical history.   Current Outpatient Medications  Medication Sig Dispense Refill   aspirin EC 81 MG tablet Take 81 mg by mouth daily. Swallow whole.     clotrimazole (LOTRIMIN) 1 % cream Apply topically 2 (two) times daily as needed.     dicyclomine (BENTYL) 10 MG capsule Take 10 mg by mouth 4 (four) times daily -  before meals and at bedtime.     gabapentin (NEURONTIN) 100 MG capsule Take 100 mg by mouth 3 (three) times daily.     isosorbide mononitrate (IMDUR) 30 MG 24 hr tablet TAKE 1 TABLET BY MOUTH EVERY MORNING 90 tablet 3   JARDIANCE 10 MG TABS tablet Take 10 mg by mouth daily.     losartan (COZAAR) 25 MG tablet TAKE 1 TABLET BY MOUTH DAILY 120 tablet 3   metFORMIN (GLUCOPHAGE) 1000 MG tablet Take 1,000 mg by mouth 2 (two) times daily.     metoprolol  succinate (TOPROL -XL) 25 MG 24 hr tablet TAKE 1 TABLET BY MOUTH DAILY 90 tablet 3   NOVOLOG 100 UNIT/ML injection Inject into the skin.     omeprazole (PRILOSEC) 40 MG capsule Take 40 mg by mouth daily.     PRESCRIPTION MEDICATION Domperidone 10mg  TID     rosuvastatin  (CRESTOR ) 40 MG tablet TAKE 1 TABLET BY MOUTH DAILY 30 tablet 2   spironolactone  (ALDACTONE ) 25 MG tablet Take 1 tablet (25 mg total) by mouth daily. 30 tablet 11   traZODone (DESYREL) 100 MG tablet Take 100 mg by mouth at bedtime.     No current facility-administered medications for this visit.    Allergies:   Patient has no known allergies.     Social History:   reports that he has never smoked. He has never used smokeless tobacco. He reports that he does not currently use alcohol. No history on file for drug use.   Family History:  family history is not on file.    ROS:     Review of Systems  Constitutional: Negative.   HENT: Negative.    Eyes: Negative.   Respiratory: Negative.    Gastrointestinal: Negative.   Genitourinary: Negative.   Musculoskeletal: Negative.   Skin: Negative.   Neurological: Negative.   Endo/Heme/Allergies: Negative.   Psychiatric/Behavioral: Negative.    All other systems reviewed and are negative.     All other systems are reviewed and negative.    PHYSICAL EXAM: VS:  BP 118/73   Pulse 94   Ht 6' (1.829 m)   Wt 231 lb 6.4 oz (105 kg)   SpO2 97%   BMI 31.38 kg/m  , BMI Body mass index is 31.38 kg/m. Last weight:  Wt Readings from Last 3 Encounters:  03/07/24 231 lb 6.4 oz (105 kg)  12/05/23 230 lb (104.3 kg)  11/16/23 232 lb (105.2 kg)     Physical Exam Vitals reviewed.  Constitutional:  Appearance: Normal appearance. He is normal weight.  HENT:     Head: Normocephalic.     Nose: Nose normal.     Mouth/Throat:     Mouth: Mucous membranes are moist.  Eyes:     Pupils: Pupils are equal, round, and reactive to light.  Cardiovascular:     Rate and Rhythm: Normal rate and regular rhythm.     Pulses: Normal pulses.     Heart sounds: Normal heart sounds.  Pulmonary:     Effort: Pulmonary effort is normal.  Abdominal:     General: Abdomen is flat. Bowel sounds are normal.  Musculoskeletal:        General: Normal range of motion.     Cervical back: Normal range of motion.  Skin:    General: Skin is warm.  Neurological:     General: No focal deficit present.     Mental Status: He is alert.  Psychiatric:        Mood and Affect: Mood normal.       EKG:   Recent Labs: 05/16/2023: BUN 22; Creatinine, Ser 1.34; Potassium 4.8; Sodium 137    Lipid Panel No  results found for: CHOL, TRIG, HDL, CHOLHDL, VLDL, LDLCALC, LDLDIRECT    Other studies Reviewed: Additional studies/ records that were reviewed today include:  Review of the above records demonstrates:       No data to display            ASSESSMENT AND PLAN:    ICD-10-CM   1. SOB (shortness of breath)  R06.02 PCV ECHOCARDIOGRAM COMPLETE    MYOCARDIAL PERFUSION IMAGING   set up echo, stress test    2. Atherosclerotic heart disease of native coronary artery with other forms of angina pectoris (HCC)  I25.118 PCV ECHOCARDIOGRAM COMPLETE    MYOCARDIAL PERFUSION IMAGING    3. Essential hypertension  I10 PCV ECHOCARDIOGRAM COMPLETE    MYOCARDIAL PERFUSION IMAGING   LABS need to be seen, as on aldactone .    4. Type 2 diabetes mellitus with diabetic polyneuropathy, with long-term current use of insulin (HCC)  E11.42 PCV ECHOCARDIOGRAM COMPLETE   Z79.4 MYOCARDIAL PERFUSION IMAGING    5. Mixed hyperlipidemia  E78.2 PCV ECHOCARDIOGRAM COMPLETE    MYOCARDIAL PERFUSION IMAGING    6. CAD S/P percutaneous coronary angioplasty  I25.10 PCV ECHOCARDIOGRAM COMPLETE   Z98.61 MYOCARDIAL PERFUSION IMAGING   Had no stress test and echo for 3 years. Advise echo, stress test.       Problem List Items Addressed This Visit       Cardiovascular and Mediastinum   Atherosclerotic heart disease of native coronary artery with other forms of angina pectoris (HCC)   Relevant Orders   PCV ECHOCARDIOGRAM COMPLETE   MYOCARDIAL PERFUSION IMAGING   Essential hypertension   Relevant Orders   PCV ECHOCARDIOGRAM COMPLETE   MYOCARDIAL PERFUSION IMAGING     Endocrine   Diabetes mellitus (HCC)   Relevant Orders   PCV ECHOCARDIOGRAM COMPLETE   MYOCARDIAL PERFUSION IMAGING     Other   Hyperlipidemia   Relevant Orders   PCV ECHOCARDIOGRAM COMPLETE   MYOCARDIAL PERFUSION IMAGING   Other Visit Diagnoses       SOB (shortness of breath)    -  Primary   set up echo, stress test    Relevant Orders   PCV ECHOCARDIOGRAM COMPLETE   MYOCARDIAL PERFUSION IMAGING     CAD S/P percutaneous coronary angioplasty       Had no stress test and  echo for 3 years. Advise echo, stress test.   Relevant Orders   PCV ECHOCARDIOGRAM COMPLETE   MYOCARDIAL PERFUSION IMAGING          Disposition:   Return in about 5 months (around 08/07/2024) for echo, stress test in 5 months.    Total time spent: 30 minutes  Signed,  Denyse Bathe, MD  03/07/2024 3:23 PM    [facility]

## 2024-05-01 ENCOUNTER — Other Ambulatory Visit: Payer: Self-pay | Admitting: Cardiovascular Disease

## 2024-05-01 DIAGNOSIS — I25118 Atherosclerotic heart disease of native coronary artery with other forms of angina pectoris: Secondary | ICD-10-CM

## 2024-05-01 DIAGNOSIS — Z794 Long term (current) use of insulin: Secondary | ICD-10-CM

## 2024-05-01 DIAGNOSIS — E782 Mixed hyperlipidemia: Secondary | ICD-10-CM

## 2024-05-01 DIAGNOSIS — I10 Essential (primary) hypertension: Secondary | ICD-10-CM

## 2024-05-04 ENCOUNTER — Other Ambulatory Visit: Payer: Self-pay | Admitting: Cardiovascular Disease

## 2024-05-04 DIAGNOSIS — I25118 Atherosclerotic heart disease of native coronary artery with other forms of angina pectoris: Secondary | ICD-10-CM

## 2024-05-04 DIAGNOSIS — I10 Essential (primary) hypertension: Secondary | ICD-10-CM

## 2024-05-04 DIAGNOSIS — E782 Mixed hyperlipidemia: Secondary | ICD-10-CM

## 2024-05-04 DIAGNOSIS — Z794 Long term (current) use of insulin: Secondary | ICD-10-CM

## 2024-07-04 ENCOUNTER — Other Ambulatory Visit: Payer: Self-pay | Admitting: Cardiovascular Disease

## 2024-07-05 ENCOUNTER — Ambulatory Visit (INDEPENDENT_AMBULATORY_CARE_PROVIDER_SITE_OTHER)

## 2024-07-05 DIAGNOSIS — I361 Nonrheumatic tricuspid (valve) insufficiency: Secondary | ICD-10-CM

## 2024-07-05 DIAGNOSIS — Z794 Long term (current) use of insulin: Secondary | ICD-10-CM

## 2024-07-05 DIAGNOSIS — I340 Nonrheumatic mitral (valve) insufficiency: Secondary | ICD-10-CM

## 2024-07-05 DIAGNOSIS — E782 Mixed hyperlipidemia: Secondary | ICD-10-CM

## 2024-07-05 DIAGNOSIS — R0602 Shortness of breath: Secondary | ICD-10-CM

## 2024-07-05 DIAGNOSIS — I2510 Atherosclerotic heart disease of native coronary artery without angina pectoris: Secondary | ICD-10-CM

## 2024-07-05 DIAGNOSIS — I10 Essential (primary) hypertension: Secondary | ICD-10-CM

## 2024-07-05 DIAGNOSIS — I25118 Atherosclerotic heart disease of native coronary artery with other forms of angina pectoris: Secondary | ICD-10-CM

## 2024-07-11 ENCOUNTER — Ambulatory Visit: Admitting: Cardiovascular Disease

## 2024-07-15 ENCOUNTER — Ambulatory Visit

## 2024-07-15 DIAGNOSIS — I10 Essential (primary) hypertension: Secondary | ICD-10-CM

## 2024-07-15 DIAGNOSIS — I2510 Atherosclerotic heart disease of native coronary artery without angina pectoris: Secondary | ICD-10-CM | POA: Diagnosis not present

## 2024-07-15 DIAGNOSIS — R0602 Shortness of breath: Secondary | ICD-10-CM | POA: Diagnosis not present

## 2024-07-15 DIAGNOSIS — E1142 Type 2 diabetes mellitus with diabetic polyneuropathy: Secondary | ICD-10-CM | POA: Diagnosis not present

## 2024-07-15 DIAGNOSIS — E782 Mixed hyperlipidemia: Secondary | ICD-10-CM

## 2024-07-15 DIAGNOSIS — Z9861 Coronary angioplasty status: Secondary | ICD-10-CM

## 2024-07-15 DIAGNOSIS — I25118 Atherosclerotic heart disease of native coronary artery with other forms of angina pectoris: Secondary | ICD-10-CM | POA: Diagnosis not present

## 2024-07-26 ENCOUNTER — Ambulatory Visit: Admitting: Cardiovascular Disease

## 2024-07-26 ENCOUNTER — Encounter: Payer: Self-pay | Admitting: Cardiovascular Disease

## 2024-07-26 VITALS — BP 120/58 | HR 82 | Ht 72.0 in | Wt 231.6 lb

## 2024-07-26 DIAGNOSIS — E782 Mixed hyperlipidemia: Secondary | ICD-10-CM | POA: Diagnosis not present

## 2024-07-26 DIAGNOSIS — I10 Essential (primary) hypertension: Secondary | ICD-10-CM | POA: Diagnosis not present

## 2024-07-26 DIAGNOSIS — I25118 Atherosclerotic heart disease of native coronary artery with other forms of angina pectoris: Secondary | ICD-10-CM | POA: Diagnosis not present

## 2024-07-26 DIAGNOSIS — Z9861 Coronary angioplasty status: Secondary | ICD-10-CM

## 2024-07-26 DIAGNOSIS — Z794 Long term (current) use of insulin: Secondary | ICD-10-CM

## 2024-07-26 DIAGNOSIS — E1142 Type 2 diabetes mellitus with diabetic polyneuropathy: Secondary | ICD-10-CM | POA: Diagnosis not present

## 2024-07-26 DIAGNOSIS — I2510 Atherosclerotic heart disease of native coronary artery without angina pectoris: Secondary | ICD-10-CM

## 2024-07-26 DIAGNOSIS — I5032 Chronic diastolic (congestive) heart failure: Secondary | ICD-10-CM

## 2024-07-26 NOTE — Progress Notes (Signed)
 Cardiology Office Note   Date:  07/26/2024   ID:  Sadarius Norman, DOB 13-Jul-1951, MRN [id number]  PCP:  Verdia Lombard, MD  Cardiologist:  Denyse Bathe, MD      History of Present Illness: Collin [name] is a 73 y.o. male who presents for  Chief Complaint  Patient presents with   Follow-up    Echo and stress follow up    Doing well      Past Medical History:  Diagnosis Date   Hyperglycemia due to type 2 diabetes mellitus (HCC) 02/10/2021     History reviewed. No pertinent surgical history.   Current Outpatient Medications  Medication Sig Dispense Refill   aspirin EC 81 MG tablet Take 81 mg by mouth daily. Swallow whole.     clotrimazole (LOTRIMIN) 1 % cream Apply topically 2 (two) times daily as needed.     dicyclomine (BENTYL) 10 MG capsule Take 10 mg by mouth 4 (four) times daily -  before meals and at bedtime.     gabapentin (NEURONTIN) 100 MG capsule Take 100 mg by mouth 3 (three) times daily.     isosorbide mononitrate (IMDUR) 30 MG 24 hr tablet TAKE 1 TABLET BY MOUTH EVERY MORNING 30 tablet 3   JARDIANCE 10 MG TABS tablet Take 10 mg by mouth daily.     losartan (COZAAR) 25 MG tablet TAKE 1 TABLET BY MOUTH DAILY 30 tablet 1   metFORMIN (GLUCOPHAGE) 1000 MG tablet Take 1,000 mg by mouth 2 (two) times daily.     metoprolol  succinate (TOPROL -XL) 25 MG 24 hr tablet TAKE 1 TABLET BY MOUTH DAILY 90 tablet 3   NOVOLOG 100 UNIT/ML injection Inject into the skin.     omeprazole (PRILOSEC) 40 MG capsule Take 40 mg by mouth daily.     PRESCRIPTION MEDICATION Domperidone 10mg  TID     rosuvastatin  (CRESTOR ) 40 MG tablet TAKE 1 TABLET BY MOUTH DAILY 30 tablet 2   spironolactone  (ALDACTONE ) 25 MG tablet TAKE 1 TABLET BY MOUTH DAILY 30 tablet 11   traZODone (DESYREL) 100 MG tablet Take 100 mg by mouth at bedtime.     No current facility-administered medications for this visit.    Allergies:   Patient has no known allergies.    Social History:   reports that he  has never smoked. He has never used smokeless tobacco. He reports that he does not currently use alcohol. No history on file for drug use.   Family History:  family history is not on file.    ROS:     Review of Systems  Constitutional: Negative.   HENT: Negative.    Eyes: Negative.   Respiratory: Negative.    Gastrointestinal: Negative.   Genitourinary: Negative.   Musculoskeletal: Negative.   Skin: Negative.   Neurological: Negative.   Endo/Heme/Allergies: Negative.   Psychiatric/Behavioral: Negative.    All other systems reviewed and are negative.     All other systems are reviewed and negative.    PHYSICAL EXAM: VS:  BP (!) 120/58   Pulse 82   Ht 6' (1.829 m)   Wt 231 lb 9.6 oz (105.1 kg)   SpO2 96%   BMI 31.41 kg/m  , BMI Body mass index is 31.41 kg/m. Last weight:  Wt Readings from Last 3 Encounters:  07/26/24 231 lb 9.6 oz (105.1 kg)  03/07/24 231 lb 6.4 oz (105 kg)  12/05/23 230 lb (104.3 kg)     Physical Exam Vitals reviewed.  Constitutional:  Appearance: Normal appearance. He is normal weight.  HENT:     Head: Normocephalic.     Nose: Nose normal.     Mouth/Throat:     Mouth: Mucous membranes are moist.  Eyes:     Pupils: Pupils are equal, round, and reactive to light.  Cardiovascular:     Rate and Rhythm: Normal rate and regular rhythm.     Pulses: Normal pulses.     Heart sounds: Normal heart sounds.  Pulmonary:     Effort: Pulmonary effort is normal.  Abdominal:     General: Abdomen is flat. Bowel sounds are normal.  Musculoskeletal:        General: Normal range of motion.     Cervical back: Normal range of motion.  Skin:    General: Skin is warm.  Neurological:     General: No focal deficit present.     Mental Status: He is alert.  Psychiatric:        Mood and Affect: Mood normal.       EKG:   Recent Labs: No results found for requested labs within last 365 days.    Lipid Panel No results found for: CHOL, TRIG,  HDL, CHOLHDL, VLDL, LDLCALC, LDLDIRECT    Other studies Reviewed: Additional studies/ records that were reviewed today include:  Review of the above records demonstrates:       No data to display            ASSESSMENT AND PLAN:    ICD-10-CM   1. CAD S/P percutaneous coronary angioplasty  I25.10    Z98.61    1stress test normal    2. Atherosclerotic heart disease of native coronary artery with other forms of angina pectoris  I25.118     3. Essential hypertension  I10     4. Type 2 diabetes mellitus with diabetic polyneuropathy, with long-term current use of insulin (HCC)  E11.42    Z79.4     5. Mixed hyperlipidemia  E78.2     6. Chronic heart failure with preserved ejection fraction (HCC)  I50.32        Problem List Items Addressed This Visit       Cardiovascular and Mediastinum   Atherosclerotic heart disease of native coronary artery with other forms of angina pectoris   Essential hypertension     Endocrine   Diabetes mellitus (HCC)     Other   Hyperlipidemia   Other Visit Diagnoses       CAD S/P percutaneous coronary angioplasty    -  Primary   1stress test normal     Chronic heart failure with preserved ejection fraction (HCC)              Disposition:   Return in about 3 months (around 10/24/2024).    Total time spent: 30 minutes  Signed,  Denyse Bathe, MD  07/26/2024 3:18 PM    [facility]

## 2024-08-01 ENCOUNTER — Other Ambulatory Visit: Payer: Self-pay | Admitting: Cardiovascular Disease

## 2024-08-01 DIAGNOSIS — I10 Essential (primary) hypertension: Secondary | ICD-10-CM

## 2024-08-01 DIAGNOSIS — I25118 Atherosclerotic heart disease of native coronary artery with other forms of angina pectoris: Secondary | ICD-10-CM

## 2024-08-01 DIAGNOSIS — E1142 Type 2 diabetes mellitus with diabetic polyneuropathy: Secondary | ICD-10-CM

## 2024-08-01 DIAGNOSIS — E782 Mixed hyperlipidemia: Secondary | ICD-10-CM

## 2024-08-28 ENCOUNTER — Other Ambulatory Visit: Payer: Self-pay | Admitting: Cardiovascular Disease

## 2024-10-21 ENCOUNTER — Ambulatory Visit: Admitting: Cardiovascular Disease
